# Patient Record
Sex: Male | Born: 1970
Health system: Southern US, Community
[De-identification: ages and names within clinical notes are randomized; demographics above are authoritative.]

## PROBLEM LIST (undated history)

## (undated) DIAGNOSIS — I10 Essential (primary) hypertension: Secondary | ICD-10-CM

## (undated) DIAGNOSIS — J45909 Unspecified asthma, uncomplicated: Secondary | ICD-10-CM

## (undated) DIAGNOSIS — T7840XA Allergy, unspecified, initial encounter: Secondary | ICD-10-CM

## (undated) HISTORY — DX: Unspecified asthma, uncomplicated: J45.909

## (undated) HISTORY — DX: Allergy, unspecified, initial encounter: T78.40XA

## (undated) HISTORY — DX: Essential (primary) hypertension: I10

---

## 2007-09-13 ENCOUNTER — Inpatient Hospital Stay (HOSPITAL_COMMUNITY): Admission: EM | Admit: 2007-09-13 | Discharge: 2007-09-14 | Payer: Self-pay | Admitting: Emergency Medicine

## 2011-05-12 NOTE — Discharge Summary (Signed)
NAMEZED, WANNINGER NO.:  [id number]   MEDICAL RECORD NO.:  [id number]          PATIENT TYPE:  INP   LOCATION:  A306                          FACILITY:  [facility]   PHYSICIAN:  Skeet Latch, DO    DATE OF BIRTH:  1971-05-13   DATE OF ADMISSION:  09/13/2007  DATE OF DISCHARGE:  09/17/2008LH                               DISCHARGE SUMMARY   PRIMARY CARE PHYSICIAN:  Patrica Duel, M.D.   DISCHARGE DIAGNOSES:  1. Sigmoid diverticulitis.  2. Abdominal pain.   BRIEF HOSPITAL COURSE:  Mr. Herendeen is a 40 year old Caucasian male with  a history of mood disorder and asthma, who presented after noticing 2  weeks prior he started having left-sided abdominal pain.  The patient  thought that the pain was related to some kidney stones.  The patient  states this subsequently got worse and 1 day prior to admission states  that the pain was a 10/10 in intensity and decided to come to the  emergency room for evaluation.  The patient described the pain as dull,  achy and constant with no radiation.  The patient did have a bowel  movement which was normal and stated that there was no blood in his  stool.  The patient has no history of similar complaints in the past but  did have, however, 2-3 kidney stones a couple of years ago.  Upon being  seen in the emergency room, he was having tenderness in his left lower  quadrant.  No rebound, rigidity or guarding.  He did have bowel sounds.   Initial lab studies had a white count 12.7.  his basic metabolic panel  was normal.  His UA showed some small bilirubin.  He did have a CT scan  of his abdomen and pelvis, which showed a fatty liver and evidence of  sigmoid diverticulitis.  Also it was recommended that the patient have a  follow-up colonoscopy after resolution of his acute inflammatory process  to exclude any underlying processes.  After being seen, the patient was  started on IV Cipro and Flagyl daily, and the patient was placed on  DVT  as well as GI prophylaxis.  The patient also placed on his Lexapro and  albuterol, which he takes daily.  Today patient is stable.  He states  this pain is greatly improved and feel the patient is stable enough for  discharge.   VITALS ON DISCHARGE:  Temperature is 97.2, pulse 75, respirations 20,  blood pressure 102/58.   CBC:  White count 13.4, hemoglobin 13.3, hematocrit 38.8, platelets 286.  Sodium 134, potassium 4.2, chloride 104, CO2 27, glucose 116, BUN 7,  creatinine 1.01.   MEDICATIONS ON DISCHARGE:  1. Albuterol inhaler as needed.  2. The patient is to restart his Lexapro at previous dose, which I      believe is 10 mg daily.  3. The patient had hydrocodone as an old prescription also.  4. Cipro 500 mg twice a day for 10 days.  5. Flagyl 500 mg three times a day for 10 days.   CONDITION ON DISCHARGE:  Stable.   DISPOSITION:  The patient to be discharged to home.   INSTRUCTIONS:  The patient is to follow up with Dr. Nobie Putnam in 7-10  days.  His diet:  The patient is to have a clear liquid diet and advance  as tolerated closer to when his antibiotics are finished.  At that time  the patient is to be a high-fiber diet.  The patient was also given  instructions about diverticulitis prior to discharge.  The patient also  told if he had any severe abdominal pain or change in symptoms to return  to emergency room for evaluation.      Skeet Latch, DO  Electronically Signed     SM/MEDQ  D:  09/14/2007  T:  09/14/2007  Job:  [id number]   cc:   Patrica Duel, M.D.  Fax: [phone]

## 2011-05-12 NOTE — H&P (Signed)
NAMEDAVEN, MONTZ NO.:  [id number]   MEDICAL RECORD NO.:  [id number]          PATIENT TYPE:  INP   LOCATION:  A306                          FACILITY:  [facility]   PHYSICIAN:  Osvaldo Shipper, MD     DATE OF BIRTH:  [birth year]   DATE OF ADMISSION:  09/13/2007  DATE OF DISCHARGE:  LH                              HISTORY & PHYSICAL   PRIMARY CARE PHYSICIAN:  Patrica Duel, M.D.   CHIEF COMPLAINT:  Left-sided abdominal pain for 2 weeks.   HISTORY OF PRESENT ILLNESS:  The patient is a 40 year old Caucasian male  with a history of mood disorder and asthma who was in his usual state of  health until two weeks ago when he started noticing pain in the left  side of his abdomen.  The patient felt that his pain was related to  kidney stones, so he ignored it.  The patient subsequently got worse,  and yesterday the pain really got worse, became 10/10 in intensity, and  he decided to come into the ED this morning.  The patient describes the  pain as a dull, aching pain, constant with no radiation.  It is made  worse with movement, relieved by lying down.  No precipitating factors  identified.  There has been some nausea with no emesis.  No history of  any diarrhea. No history of constipation as well.  He had a bowel  movement this morning which was normal, no blood in the stool.   No similar history of such complaints in the past.  He passed 2-3 kidney  stones a couple of years ago, but since then he has had no issues.  He  also gives a history of frequent urination with no dysuria at this time.   Denies any fever at home.   MEDICATIONS AT HOME:  1. Albuterol inhaler as needed.  2. Lexapro, but he has been off this for 1-1/2 months because he ran      out of this medication.   ALLERGIES:  PENICILLIN caused facial swelling when he was a child.   PAST MEDICAL HISTORY:  1. Asthma.  2. History of migraine headaches.  3. Mood disorder.   PAST SURGICAL HISTORY:  None.   SOCIAL HISTORY:  He lives in [place], works as a Naval architect,  operates in the [employer].  No tobacco use, no drug  use, occasional alcohol use.   FAMILY HISTORY:  Positive for coronary artery disease.  No history of  any cancers in the family as far as he knows.   REVIEW OF SYSTEMS:  GENERAL:  Positive for mild weakness.  HEENT:  Unremarkable.  CARDIOVASCULAR:  Unremarkable.  RESPIRATORY:  Unremarkable.  GI:  As in HPI.  GU:  As in HPI.  MUSCULOSKELETAL:  Unremarkable.  DERMATOLOGIC:  Unremarkable.  PSYCHIATRIC:  Unremarkable.  RHEUMATOLOGIC:  Unremarkable.  Other systems are unremarkable.   PHYSICAL EXAMINATION:  VITAL SIGNS:  In the ED, his temperature ws 97.5,  blood pressure 119/78, respiratory rate 20, pulse 82, O2 saturation 99%  on room air.  GENERAL:  Obese  white male in no distress.  HEENT:  No pallor, no icterus.  Oral mucous membranes moist.  No oral  lesions are noted.  LUNGS:  Clear to auscultation bilaterally.  CARDIOVASCULAR:  S1 and S2 normal, regular.  No murmurs appreciated.  No  rubs or bruits heard.  ABDOMEN:  Soft with tenderness in the left lower quadrant with no  rebound, rigidity, or guarding.  Bowel sounds are present.  No mass or  organomegaly appreciated.  EXTREMITIES:  No edema.  Peripheral pulses are palpable.  NEUROLOGIC:  No neurological deficits present.  Alert and oriented x3.   LABORATORY DATA:  White count 12.7.  BMET is normal.  UA showed small  bilirubin.  Blood cultures pending.   IMAGING STUDIES:  He did have a CAT scan of the abdomen and pelvis which  showed a fatty liver and showed evidence for sigmoid diverticulitis.   ASSESSMENT:  This is a 40 year old Caucasian male with no significant  medical problems who presents with left-sided abdominal pain for 2 weeks  and is found to have diverticulitis.  He does not have any medical  issues at this time.   PLAN:  1. Sigmoid diverticulitis.  We will keep him n.p.o.  today; maybe he      can just have some ice chips.  We will treat him with Cipro and      Flagyl.  He will need at least a 2-week course.  We will give him      initially IV to make sure he stabilizes and his symptoms improve.      We will recheck his white count tomorrow morning.  He will need to      be set up with GI in about 6-8 weeks to undergo colonoscopy at a      future date.  At this time, I do not think he warrants inpatient GI      evaluation.  2. Deep vein thrombosis and gastrointestinal prophylaxes initiated.  3. Continue Lexapro and albuterol inhaler.  4. Will provide pain relief with Dilaudid and antiemetics with Zofran.      Osvaldo Shipper, MD  Electronically Signed     GK/MEDQ  D:  09/13/2007  T:  09/13/2007  Job:  [id number]   cc:   Patrica Duel, M.D.  Fax: [phone]   R. Roetta Sessions, M.D.  [address]    [place] [address]

## 2011-10-08 LAB — DIFFERENTIAL
Blasts: 0
Eosinophils Relative: 1
Eosinophils Relative: 1
Lymphocytes Relative: 17
Lymphs Abs: 2.1
Metamyelocytes Relative: 0
Monocytes Relative: 8
Myelocytes: 0
Neutro Abs: 9.4 — ABNORMAL HIGH
Neutrophils Relative %: 74
Neutrophils Relative %: 80 — ABNORMAL HIGH
nRBC: 0

## 2011-10-08 LAB — CULTURE, BLOOD (ROUTINE X 2)
Culture: NO GROWTH
Culture: NO GROWTH
Report Status: 9212008
Report Status: 9212008

## 2011-10-08 LAB — CBC
HCT: 38.6 — ABNORMAL LOW
MCV: 89.4
Platelets: 280
Platelets: 286
RDW: 13.4
WBC: 12.7 — ABNORMAL HIGH
WBC: 13.4 — ABNORMAL HIGH

## 2011-10-08 LAB — HEPATIC FUNCTION PANEL
ALT: 32
AST: 25
Albumin: 3.4 — ABNORMAL LOW
Alkaline Phosphatase: 68
Total Protein: 6.5

## 2011-10-08 LAB — BASIC METABOLIC PANEL
BUN: 10
BUN: 7
Calcium: 8.6
Creatinine, Ser: 1.01
GFR calc non Af Amer: >60
GFR calc non Af Amer: >60
Glucose, Bld: 116 — ABNORMAL HIGH
Potassium: 4

## 2011-10-08 LAB — URINALYSIS, ROUTINE W REFLEX MICROSCOPIC
Ketones, ur: NEGATIVE
Nitrite: NEGATIVE
Specific Gravity, Urine: 1.025
pH: 6

## 2016-03-19 ENCOUNTER — Ambulatory Visit (INDEPENDENT_AMBULATORY_CARE_PROVIDER_SITE_OTHER): Payer: 59 | Admitting: Nurse Practitioner

## 2016-03-19 ENCOUNTER — Encounter: Payer: Self-pay | Admitting: Nurse Practitioner

## 2016-03-19 VITALS — BP 126/86 | Ht 71.0 in | Wt 253.2 lb

## 2016-03-19 DIAGNOSIS — J069 Acute upper respiratory infection, unspecified: Secondary | ICD-10-CM | POA: Diagnosis not present

## 2016-03-19 DIAGNOSIS — B9689 Other specified bacterial agents as the cause of diseases classified elsewhere: Secondary | ICD-10-CM

## 2016-03-19 MED ORDER — AZITHROMYCIN 250 MG PO TABS: ORAL_TABLET | ORAL | Status: DC

## 2016-03-19 MED ORDER — HYDROCODONE-HOMATROPINE 5-1.5 MG/5ML PO SYRP: 5.0000 mL | ORAL_SOLUTION | ORAL | Status: DC | PRN

## 2016-03-19 NOTE — Patient Instructions (Signed)
Nasacort AQ as directed

## 2016-03-21 ENCOUNTER — Encounter: Payer: Self-pay | Admitting: Nurse Practitioner

## 2016-03-21 NOTE — Progress Notes (Signed)
Subjective:  Presents as a new patient for complaints of congestion and sore throat that began on 3/19. Fever. Had a bad headache initially, this has resolved. Head congestion. Spells of nonproductive cough. Has used his inhaler 1. Occasional posttussive vomiting. No nausea. No diarrhea or abdominal pain. Taking fluids well. Voiding normal limit. Cough worse at nighttime.  Objective:   BP 126/86 mmHg  Ht 5' 11"  (1.803 m)  Wt 253 lb 4 oz (114.873 kg)  BMI 35.34 kg/m2 NAD. Alert, oriented. TMs clear effusion, no erythema. Pharynx mildly erythematous with slightly green PND noted. Neck supple with mild soft anterior adenopathy. Lungs clear. Heart regular rate rhythm.  Assessment: Bacterial upper respiratory infection  Plan:  Meds ordered this encounter  Medications                              . azithromycin (ZITHROMAX Z-PAK) 250 MG tablet    Sig: Take 2 tablets (500 mg) on  Day 1,  followed by 1 tablet (250 mg) once daily on Days 2 through 5.    Dispense:  6 each    Refill:  0    Order Specific Question:  Supervising Provider    Answer:  Mikey Kirschner [[id number]]  . HYDROcodone-homatropine (HYCODAN) 5-1.5 MG/5ML syrup    Sig: Take 5 mLs by mouth every 4 (four) hours as needed.    Dispense:  120 mL    Refill:  0    Order Specific Question:  Supervising Provider    Answer:  Mikey Kirschner [[id number]]   OTC meds as directed for daytime use. Drowsiness precautions for Hycodan syrup. Callback in 7-10 days if no improvement, sooner if worse.

## 2016-12-30 ENCOUNTER — Encounter: Payer: Self-pay | Admitting: Family Medicine

## 2016-12-30 ENCOUNTER — Ambulatory Visit (INDEPENDENT_AMBULATORY_CARE_PROVIDER_SITE_OTHER): Payer: 59 | Admitting: Family Medicine

## 2016-12-30 ENCOUNTER — Other Ambulatory Visit: Payer: Self-pay

## 2016-12-30 DIAGNOSIS — I10 Essential (primary) hypertension: Secondary | ICD-10-CM | POA: Diagnosis not present

## 2016-12-30 DIAGNOSIS — E7849 Other hyperlipidemia: Secondary | ICD-10-CM | POA: Insufficient documentation

## 2016-12-30 DIAGNOSIS — Z566 Other physical and mental strain related to work: Secondary | ICD-10-CM

## 2016-12-30 DIAGNOSIS — E1169 Type 2 diabetes mellitus with other specified complication: Secondary | ICD-10-CM | POA: Insufficient documentation

## 2016-12-30 MED ORDER — VENTOLIN HFA 108 (90 BASE) MCG/ACT IN AERS: 2.0000 | INHALATION_SPRAY | RESPIRATORY_TRACT | 12 refills | Status: DC | PRN

## 2016-12-30 MED ORDER — LISINOPRIL 10 MG PO TABS: 10.0000 mg | ORAL_TABLET | Freq: Every day | ORAL | 1 refills | Status: DC

## 2016-12-30 MED ORDER — CITALOPRAM HYDROBROMIDE 10 MG PO TABS: 10.0000 mg | ORAL_TABLET | Freq: Every day | ORAL | 1 refills | Status: DC

## 2016-12-30 MED ORDER — CITALOPRAM HYDROBROMIDE 20 MG PO TABS: 20.0000 mg | ORAL_TABLET | Freq: Every day | ORAL | 1 refills | Status: DC

## 2016-12-30 NOTE — Progress Notes (Signed)
   Subjective:    Patient ID: Frank Flores, male    DOB: Sep 27, 1971, 46 y.o.   MRN: [id number]  HPI   Patient presents to office today for medication management.  He states he needs refills on all  Medications.  Patient states he has DOT physical coming up. This patient denies any family history of heart disease cancer or cholesterol issues. 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 Has had blood pressure issues for several years and states he has taken Celexa for greater than 10 years and it done well with it.   Review of Systems  Constitutional: Negative for activity change, fatigue and fever.  Respiratory: Negative for cough and shortness of breath.   Cardiovascular: Negative for chest pain and leg swelling.  Neurological: Negative for headaches.       Objective:   Physical Exam  Constitutional: He appears well-nourished. No distress.  Cardiovascular: Normal rate, regular rhythm and normal heart sounds.   No murmur heard. Pulmonary/Chest: Effort normal and breath sounds normal. No respiratory distress.  Musculoskeletal: He exhibits no edema.  Lymphadenopathy:    He has no cervical adenopathy.  Neurological: He is alert.  Psychiatric: His behavior is normal.  Vitals reviewed.         Assessment & Plan:  Stress-stable. On Celexa doing well with this. Continue medication  HTN 90 day prescription given follow-up in 6 months  Watch diet stay physically active try to lose weight  Encourage patient to follow-up 6 months awaiting lab and records from his previous doctor [place]

## 2016-12-30 NOTE — Patient Instructions (Signed)
DASH Eating Plan DASH stands for "Dietary Approaches to Stop Hypertension." The DASH eating plan is a healthy eating plan that has been shown to reduce high blood pressure (hypertension). Additional health benefits may include reducing the risk of type 2 diabetes mellitus, heart disease, and stroke. The DASH eating plan may also help with weight loss. What do I need to know about the DASH eating plan? For the DASH eating plan, you will follow these general guidelines:  Choose foods with less than 150 milligrams of sodium per serving (as listed on the food label).  Use salt-free seasonings or herbs instead of table salt or sea salt.  Check with your health care provider or pharmacist before using salt substitutes.  Eat lower-sodium products. These are often labeled as "low-sodium" or "no salt added."  Eat fresh foods. Avoid eating a lot of canned foods.  Eat more vegetables, fruits, and low-fat dairy products.  Choose whole grains. Look for the word "whole" as the first word in the ingredient list.  Choose fish and skinless chicken or [place] more often than red meat. Limit fish, poultry, and meat to 6 oz (170 g) each day.  Limit sweets, desserts, sugars, and sugary drinks.  Choose heart-healthy fats.  Eat more home-cooked food and less restaurant, buffet, and fast food.  Limit fried foods.  Do not fry foods. Cook foods using methods such as baking, boiling, grilling, and broiling instead.  When eating at a restaurant, ask that your food be prepared with less salt, or no salt if possible. What foods can I eat? Seek help from a dietitian for individual calorie needs. Grains  Whole grain or whole wheat bread. Brown rice. Whole grain or whole wheat pasta. Quinoa, bulgur, and whole grain cereals. Low-sodium cereals. Corn or whole wheat flour tortillas. Whole grain cornbread. Whole grain crackers. Low-sodium crackers. Vegetables  Fresh or frozen vegetables (raw, steamed, roasted, or  grilled). Low-sodium or reduced-sodium tomato and vegetable juices. Low-sodium or reduced-sodium tomato sauce and paste. Low-sodium or reduced-sodium canned vegetables. Fruits  All fresh, canned (in natural juice), or frozen fruits. Meat and Other Protein Products  Ground beef (85% or leaner), grass-fed beef, or beef trimmed of fat. Skinless chicken or [place]. Ground chicken or [place]. Pork trimmed of fat. All fish and seafood. Eggs. Dried beans, peas, or lentils. Unsalted nuts and seeds. Unsalted canned beans. Dairy  Low-fat dairy products, such as skim or 1% milk, 2% or reduced-fat cheeses, low-fat ricotta or cottage cheese, or plain low-fat yogurt. Low-sodium or reduced-sodium cheeses. Fats and Oils  Tub margarines without trans fats. Light or reduced-fat mayonnaise and salad dressings (reduced sodium). Avocado. Safflower, olive, or canola oils. Natural peanut or almond butter. Other  Unsalted popcorn and pretzels. The items listed above may not be a complete list of recommended foods or beverages. Contact your dietitian for more options.  What foods are not recommended? Grains  White bread. White pasta. White rice. Refined cornbread. Bagels and croissants. Crackers that contain trans fat. Vegetables  Creamed or fried vegetables. Vegetables in a cheese sauce. Regular canned vegetables. Regular canned tomato sauce and paste. Regular tomato and vegetable juices. Fruits  Canned fruit in light or heavy syrup. Fruit juice. Meat and Other Protein Products  Fatty cuts of meat. Ribs, chicken wings, bacon, sausage, bologna, salami, chitterlings, fatback, hot dogs, bratwurst, and packaged luncheon meats. Salted nuts and seeds. Canned beans with salt. Dairy  Whole or 2% milk, cream, half-and-half, and cream cheese. Whole-fat or sweetened yogurt. Full-fat cheeses  or blue cheese. Nondairy creamers and whipped toppings. Processed cheese, cheese spreads, or cheese curds. Condiments  Onion and garlic  salt, seasoned salt, table salt, and sea salt. Canned and packaged gravies. Worcestershire sauce. Tartar sauce. Barbecue sauce. Teriyaki sauce. Soy sauce, including reduced sodium. Steak sauce. Fish sauce. Oyster sauce. Cocktail sauce. Horseradish. Ketchup and mustard. Meat flavorings and tenderizers. Bouillon cubes. Hot sauce. Tabasco sauce. Marinades. Taco seasonings. Relishes. Fats and Oils  Butter, stick margarine, lard, shortening, ghee, and bacon fat. Coconut, palm kernel, or palm oils. Regular salad dressings. Other  Pickles and olives. Salted popcorn and pretzels. The items listed above may not be a complete list of foods and beverages to avoid. Contact your dietitian for more information.  Where can I find more information? National Heart, Lung, and Blood Institute: travelstabloid.com This information is not intended to replace advice given to you by your health care provider. Make sure you discuss any questions you have with your health care provider. Document Released: 12/03/2011 Document Revised: 05/21/2016 Document Reviewed: 10/18/2013 Elsevier Interactive Patient Education  2017 [employer].

## 2017-01-19 ENCOUNTER — Ambulatory Visit (INDEPENDENT_AMBULATORY_CARE_PROVIDER_SITE_OTHER): Payer: 59 | Admitting: Family Medicine

## 2017-01-19 ENCOUNTER — Encounter: Payer: Self-pay | Admitting: Family Medicine

## 2017-01-19 VITALS — BP 114/76

## 2017-01-19 DIAGNOSIS — J019 Acute sinusitis, unspecified: Secondary | ICD-10-CM

## 2017-01-19 DIAGNOSIS — B9689 Other specified bacterial agents as the cause of diseases classified elsewhere: Secondary | ICD-10-CM

## 2017-01-19 MED ORDER — PREDNISONE 20 MG PO TABS: ORAL_TABLET | ORAL | 0 refills | Status: DC

## 2017-01-19 MED ORDER — AZITHROMYCIN 250 MG PO TABS: ORAL_TABLET | ORAL | 0 refills | Status: DC

## 2017-01-19 NOTE — Progress Notes (Signed)
   Subjective:    Patient ID: Frank Flores, male    DOB: October 14, 1971, 46 y.o.   MRN: [id number]  Cough  This is a new problem. The current episode started in the past 7 days. Associated symptoms include headaches, nasal congestion, a sore throat and wheezing. Treatments tried: otc meds.   This patient relates significant amount of cough congestion denies high fever chills sweats denies  difficulty breathing. He does relate intermittent wheeze that he uses albuterol for he does not smoke has never had to go to ER had been admitted for asthma   Review of Systems  HENT: Positive for sore throat.   Respiratory: Positive for cough and wheezing.   Neurological: Positive for headaches.       Objective:   Physical Exam  Lungs clear no crackles heart is regular pulse normal extremities no edema skin warm dry      Assessment & Plan:  Hypertension-overall doing well patient will do a follow-up office visit later this summer  Viral URI secondary rhinosinusitis antibiotic prescribed warning signs discussed albuterol when necessary cut back on Celexa to half the dose for the next 5 days  Prednisone prescription printed and given to the patient to use if he has progression in wheezing if significantly worse he needs to let us know. X-rays lab work not indicated currently

## 2017-07-01 ENCOUNTER — Encounter: Payer: Self-pay | Admitting: Family Medicine

## 2017-07-01 ENCOUNTER — Ambulatory Visit (INDEPENDENT_AMBULATORY_CARE_PROVIDER_SITE_OTHER): Payer: 59 | Admitting: Family Medicine

## 2017-07-01 VITALS — BP 122/78 | Ht 71.0 in | Wt 240.4 lb

## 2017-07-01 DIAGNOSIS — I10 Essential (primary) hypertension: Secondary | ICD-10-CM | POA: Diagnosis not present

## 2017-07-01 MED ORDER — CITALOPRAM HYDROBROMIDE 20 MG PO TABS: 20.0000 mg | ORAL_TABLET | Freq: Every day | ORAL | 1 refills | Status: DC

## 2017-07-01 MED ORDER — LISINOPRIL 10 MG PO TABS: 10.0000 mg | ORAL_TABLET | Freq: Every day | ORAL | 1 refills | Status: DC

## 2017-07-01 NOTE — Patient Instructions (Signed)
DASH Eating Plan DASH stands for "Dietary Approaches to Stop Hypertension." The DASH eating plan is a healthy eating plan that has been shown to reduce high blood pressure (hypertension). It may also reduce your risk for type 2 diabetes, heart disease, and stroke. The DASH eating plan may also help with weight loss. What are tips for following this plan? General guidelines  Avoid eating more than 2,300 mg (milligrams) of salt (sodium) a day. If you have hypertension, you may need to reduce your sodium intake to 1,500 mg a day.  Limit alcohol intake to no more than 1 drink a day for nonpregnant women and 2 drinks a day for men. One drink equals 12 oz of beer, 5 oz of wine, or 1 oz of hard liquor.  Work with your health care provider to maintain a healthy body weight or to lose weight. Ask what an ideal weight is for you.  Get at least 30 minutes of exercise that causes your heart to beat faster (aerobic exercise) most days of the week. Activities may include walking, swimming, or biking.  Work with your health care provider or diet and nutrition specialist (dietitian) to adjust your eating plan to your individual calorie needs. Reading food labels  Check food labels for the amount of sodium per serving. Choose foods with less than 5 percent of the Daily Value of sodium. Generally, foods with less than 300 mg of sodium per serving fit into this eating plan.  To find whole grains, look for the word "whole" as the first word in the ingredient list. Shopping  Buy products labeled as "low-sodium" or "no salt added."  Buy fresh foods. Avoid canned foods and premade or frozen meals. Cooking  Avoid adding salt when cooking. Use salt-free seasonings or herbs instead of table salt or sea salt. Check with your health care provider or pharmacist before using salt substitutes.  Do not fry foods. Cook foods using healthy methods such as baking, boiling, grilling, and broiling instead.  Cook with  heart-healthy oils, such as olive, canola, soybean, or sunflower oil. Meal planning   Eat a balanced diet that includes: ? 5 or more servings of fruits and vegetables each day. At each meal, try to fill half of your plate with fruits and vegetables. ? Up to 6-8 servings of whole grains each day. ? Less than 6 oz of lean meat, poultry, or fish each day. A 3-oz serving of meat is about the same size as a deck of cards. One egg equals 1 oz. ? 2 servings of low-fat dairy each day. ? A serving of nuts, seeds, or beans 5 times each week. ? Heart-healthy fats. Healthy fats called Omega-3 fatty acids are found in foods such as flaxseeds and coldwater fish, like sardines, salmon, and mackerel.  Limit how much you eat of the following: ? Canned or prepackaged foods. ? Food that is high in trans fat, such as fried foods. ? Food that is high in saturated fat, such as fatty meat. ? Sweets, desserts, sugary drinks, and other foods with added sugar. ? Full-fat dairy products.  Do not salt foods before eating.  Try to eat at least 2 vegetarian meals each week.  Eat more home-cooked food and less restaurant, buffet, and fast food.  When eating at a restaurant, ask that your food be prepared with less salt or no salt, if possible. What foods are recommended? The items listed may not be a complete list. Talk with your dietitian about what   dietary choices are best for you. Grains Whole-grain or whole-wheat bread. Whole-grain or whole-wheat pasta. Brown rice. Oatmeal. Quinoa. Bulgur. Whole-grain and low-sodium cereals. Pita bread. Low-fat, low-sodium crackers. Whole-wheat flour tortillas. Vegetables Fresh or frozen vegetables (raw, steamed, roasted, or grilled). Low-sodium or reduced-sodium tomato and vegetable juice. Low-sodium or reduced-sodium tomato sauce and tomato paste. Low-sodium or reduced-sodium canned vegetables. Fruits All fresh, dried, or frozen fruit. Canned fruit in natural juice (without  added sugar). Meat and other protein foods Skinless chicken or turkey. Ground chicken or turkey. Pork with fat trimmed off. Fish and seafood. Egg whites. Dried beans, peas, or lentils. Unsalted nuts, nut butters, and seeds. Unsalted canned beans. Lean cuts of beef with fat trimmed off. Low-sodium, lean deli meat. Dairy Low-fat (1%) or fat-free (skim) milk. Fat-free, low-fat, or reduced-fat cheeses. Nonfat, low-sodium ricotta or cottage cheese. Low-fat or nonfat yogurt. Low-fat, low-sodium cheese. Fats and oils Soft margarine without trans fats. Vegetable oil. Low-fat, reduced-fat, or light mayonnaise and salad dressings (reduced-sodium). Canola, safflower, olive, soybean, and sunflower oils. Avocado. Seasoning and other foods Herbs. Spices. Seasoning mixes without salt. Unsalted popcorn and pretzels. Fat-free sweets. What foods are not recommended? The items listed may not be a complete list. Talk with your dietitian about what dietary choices are best for you. Grains Baked goods made with fat, such as croissants, muffins, or some breads. Dry pasta or rice meal packs. Vegetables Creamed or fried vegetables. Vegetables in a cheese sauce. Regular canned vegetables (not low-sodium or reduced-sodium). Regular canned tomato sauce and paste (not low-sodium or reduced-sodium). Regular tomato and vegetable juice (not low-sodium or reduced-sodium). Pickles. Olives. Fruits Canned fruit in a light or heavy syrup. Fried fruit. Fruit in cream or butter sauce. Meat and other protein foods Fatty cuts of meat. Ribs. Fried meat. Bacon. Sausage. Bologna and other processed lunch meats. Salami. Fatback. Hotdogs. Bratwurst. Salted nuts and seeds. Canned beans with added salt. Canned or smoked fish. Whole eggs or egg yolks. Chicken or turkey with skin. Dairy Whole or 2% milk, cream, and half-and-half. Whole or full-fat cream cheese. Whole-fat or sweetened yogurt. Full-fat cheese. Nondairy creamers. Whipped toppings.  Processed cheese and cheese spreads. Fats and oils Butter. Stick margarine. Lard. Shortening. Ghee. Bacon fat. Tropical oils, such as coconut, palm kernel, or palm oil. Seasoning and other foods Salted popcorn and pretzels. Onion salt, garlic salt, seasoned salt, table salt, and sea salt. Worcestershire sauce. Tartar sauce. Barbecue sauce. Teriyaki sauce. Soy sauce, including reduced-sodium. Steak sauce. Canned and packaged gravies. Fish sauce. Oyster sauce. Cocktail sauce. Horseradish that you find on the shelf. Ketchup. Mustard. Meat flavorings and tenderizers. Bouillon cubes. Hot sauce and Tabasco sauce. Premade or packaged marinades. Premade or packaged taco seasonings. Relishes. Regular salad dressings. Where to find more information:  National Heart, Lung, and Blood Institute: www.nhlbi.nih.gov  American Heart Association: www.heart.org Summary  The DASH eating plan is a healthy eating plan that has been shown to reduce high blood pressure (hypertension). It may also reduce your risk for type 2 diabetes, heart disease, and stroke.  With the DASH eating plan, you should limit salt (sodium) intake to 2,300 mg a day. If you have hypertension, you may need to reduce your sodium intake to 1,500 mg a day.  When on the DASH eating plan, aim to eat more fresh fruits and vegetables, whole grains, lean proteins, low-fat dairy, and heart-healthy fats.  Work with your health care provider or diet and nutrition specialist (dietitian) to adjust your eating plan to your individual   calorie needs. This information is not intended to replace advice given to you by your health care provider. Make sure you discuss any questions you have with your health care provider. Document Released: 12/03/2011 Document Revised: 12/07/2016 Document Reviewed: 12/07/2016 Elsevier Interactive Patient Education  2017 [employer] Inc.  

## 2017-07-01 NOTE — Progress Notes (Signed)
   Subjective:    Patient ID: Frank Flores, male    DOB: September 30, 1971, 46 y.o.   MRN: [id number]  Hypertension  This is a chronic problem. The current episode started more than 1 year ago. Pertinent negatives include no chest pain, headaches or shortness of breath.   States no other concerns this visit. Patient has been watching diet try and excise try to lose weight Plays a lot of golf Under a lot of stress with his work but denies being depressed about it. Review of Systems  Constitutional: Negative for activity change, fatigue and fever.  Respiratory: Negative for cough and shortness of breath.   Cardiovascular: Negative for chest pain and leg swelling.  Neurological: Negative for headaches.       Objective:   Physical Exam  Constitutional: He appears well-nourished. No distress.  Cardiovascular: Normal rate, regular rhythm and normal heart sounds.   No murmur heard. Pulmonary/Chest: Effort normal and breath sounds normal. No respiratory distress.  Musculoskeletal: He exhibits no edema.  Lymphadenopathy:    He has no cervical adenopathy.  Neurological: He is alert.  Psychiatric: His behavior is normal.  Vitals reviewed.   His workplace is doing lab work he will send [place] a copy      Assessment & Plan:  HTN good control watch diet stay active continue try to lose weight  Continue medication follow-up in 6 months  Moods doing well continue Celexa  Patient is working hard at losing weight watch diet exercise follow-up 6 months

## 2017-12-31 ENCOUNTER — Encounter: Payer: Self-pay | Admitting: Family Medicine

## 2017-12-31 ENCOUNTER — Ambulatory Visit: Payer: 59 | Admitting: Family Medicine

## 2017-12-31 VITALS — BP 110/80 | Ht 71.0 in | Wt 252.0 lb

## 2017-12-31 DIAGNOSIS — I10 Essential (primary) hypertension: Secondary | ICD-10-CM

## 2017-12-31 DIAGNOSIS — Z13220 Encounter for screening for lipoid disorders: Secondary | ICD-10-CM

## 2017-12-31 MED ORDER — CITALOPRAM HYDROBROMIDE 20 MG PO TABS: 20.0000 mg | ORAL_TABLET | Freq: Every day | ORAL | 1 refills | Status: DC

## 2017-12-31 MED ORDER — LISINOPRIL 10 MG PO TABS: 10.0000 mg | ORAL_TABLET | Freq: Every day | ORAL | 1 refills | Status: DC

## 2017-12-31 NOTE — Progress Notes (Signed)
   Subjective:    Patient ID: Frank Flores, male    DOB: Mar 29, 1971, 47 y.o.   MRN: [id number]  Hypertension  This is a chronic problem. Pertinent negatives include no chest pain, headaches or shortness of breath. Treatments tried: lisinopril.   Right arm pain off and on for 6 months.  Patient had intermittent right arm pain in the deltoid region bicep region is during the summer when he is doing a lot of lifting it is now actually doing much better he denies any numbness or tingling  He states he is taking blood pressure medicine watching diet trying to exercise some denies any chest tightness pressure pain shortness of breath  Review of Systems  Constitutional: Negative for activity change, fatigue and fever.  Respiratory: Negative for cough and shortness of breath.   Cardiovascular: Negative for chest pain and leg swelling.  Neurological: Negative for headaches.       Objective:   Physical Exam  Constitutional: He appears well-nourished. No distress.  Cardiovascular: Normal rate, regular rhythm and normal heart sounds.  No murmur heard. Pulmonary/Chest: Effort normal and breath sounds normal. No respiratory distress.  Musculoskeletal: He exhibits no edema.  Lymphadenopathy:    He has no cervical adenopathy.  Neurological: He is alert.  Psychiatric: His behavior is normal.  Vitals reviewed.   Blood pressure was rechecked twice a letter was written for the patient blood pressure under good control      Assessment & Plan:  HTN- Patient was seen today as part of a visit regarding hypertension. The importance of healthy diet and regular physical activity was discussed. The importance of compliance with medications discussed. Ideal goal is to keep blood pressure low elevated levels certainly below 782/42 when possible. The patient was counseled that keeping blood pressure under control lessen his risk of heart attack, stroke, kidney failure, and early death. The importance of  regular follow-ups was discussed with the patient. Low-salt diet such as DASH recommended. Regular physical activity was recommended as well. Patient was advised to keep regular follow-ups.  I would advise patient to get lab work including metabolic 7 and lipid await the results of this  Patient was encouraged to exercise watch diet try to trim weight down continue medication follow-up 6 months  The right arm pain returns to follow-up

## 2017-12-31 NOTE — Patient Instructions (Signed)
DASH Eating Plan DASH stands for "Dietary Approaches to Stop Hypertension." The DASH eating plan is a healthy eating plan that has been shown to reduce high blood pressure (hypertension). It may also reduce your risk for type 2 diabetes, heart disease, and stroke. The DASH eating plan may also help with weight loss. What are tips for following this plan? General guidelines  Avoid eating more than 2,300 mg (milligrams) of salt (sodium) a day. If you have hypertension, you may need to reduce your sodium intake to 1,500 mg a day.  Limit alcohol intake to no more than 1 drink a day for nonpregnant women and 2 drinks a day for men. One drink equals 12 oz of beer, 5 oz of wine, or 1 oz of hard liquor.  Work with your health care provider to maintain a healthy body weight or to lose weight. Ask what an ideal weight is for you.  Get at least 30 minutes of exercise that causes your heart to beat faster (aerobic exercise) most days of the week. Activities may include walking, swimming, or biking.  Work with your health care provider or diet and nutrition specialist (dietitian) to adjust your eating plan to your individual calorie needs. Reading food labels  Check food labels for the amount of sodium per serving. Choose foods with less than 5 percent of the Daily Value of sodium. Generally, foods with less than 300 mg of sodium per serving fit into this eating plan.  To find whole grains, look for the word "whole" as the first word in the ingredient list. Shopping  Buy products labeled as "low-sodium" or "no salt added."  Buy fresh foods. Avoid canned foods and premade or frozen meals. Cooking  Avoid adding salt when cooking. Use salt-free seasonings or herbs instead of table salt or sea salt. Check with your health care provider or pharmacist before using salt substitutes.  Do not fry foods. Cook foods using healthy methods such as baking, boiling, grilling, and broiling instead.  Cook with  heart-healthy oils, such as olive, canola, soybean, or sunflower oil. Meal planning   Eat a balanced diet that includes: ? 5 or more servings of fruits and vegetables each day. At each meal, try to fill half of your plate with fruits and vegetables. ? Up to 6-8 servings of whole grains each day. ? Less than 6 oz of lean meat, poultry, or fish each day. A 3-oz serving of meat is about the same size as a deck of cards. One egg equals 1 oz. ? 2 servings of low-fat dairy each day. ? A serving of nuts, seeds, or beans 5 times each week. ? Heart-healthy fats. Healthy fats called Omega-3 fatty acids are found in foods such as flaxseeds and coldwater fish, like sardines, salmon, and mackerel.  Limit how much you eat of the following: ? Canned or prepackaged foods. ? Food that is high in trans fat, such as fried foods. ? Food that is high in saturated fat, such as fatty meat. ? Sweets, desserts, sugary drinks, and other foods with added sugar. ? Full-fat dairy products.  Do not salt foods before eating.  Try to eat at least 2 vegetarian meals each week.  Eat more home-cooked food and less restaurant, buffet, and fast food.  When eating at a restaurant, ask that your food be prepared with less salt or no salt, if possible. What foods are recommended? The items listed may not be a complete list. Talk with your dietitian about what   dietary choices are best for you. Grains Whole-grain or whole-wheat bread. Whole-grain or whole-wheat pasta. Brown rice. Oatmeal. Quinoa. Bulgur. Whole-grain and low-sodium cereals. Pita bread. Low-fat, low-sodium crackers. Whole-wheat flour tortillas. Vegetables Fresh or frozen vegetables (raw, steamed, roasted, or grilled). Low-sodium or reduced-sodium tomato and vegetable juice. Low-sodium or reduced-sodium tomato sauce and tomato paste. Low-sodium or reduced-sodium canned vegetables. Fruits All fresh, dried, or frozen fruit. Canned fruit in natural juice (without  added sugar). Meat and other protein foods Skinless chicken or turkey. Ground chicken or turkey. Pork with fat trimmed off. Fish and seafood. Egg whites. Dried beans, peas, or lentils. Unsalted nuts, nut butters, and seeds. Unsalted canned beans. Lean cuts of beef with fat trimmed off. Low-sodium, lean deli meat. Dairy Low-fat (1%) or fat-free (skim) milk. Fat-free, low-fat, or reduced-fat cheeses. Nonfat, low-sodium ricotta or cottage cheese. Low-fat or nonfat yogurt. Low-fat, low-sodium cheese. Fats and oils Soft margarine without trans fats. Vegetable oil. Low-fat, reduced-fat, or light mayonnaise and salad dressings (reduced-sodium). Canola, safflower, olive, soybean, and sunflower oils. Avocado. Seasoning and other foods Herbs. Spices. Seasoning mixes without salt. Unsalted popcorn and pretzels. Fat-free sweets. What foods are not recommended? The items listed may not be a complete list. Talk with your dietitian about what dietary choices are best for you. Grains Baked goods made with fat, such as croissants, muffins, or some breads. Dry pasta or rice meal packs. Vegetables Creamed or fried vegetables. Vegetables in a cheese sauce. Regular canned vegetables (not low-sodium or reduced-sodium). Regular canned tomato sauce and paste (not low-sodium or reduced-sodium). Regular tomato and vegetable juice (not low-sodium or reduced-sodium). Pickles. Olives. Fruits Canned fruit in a light or heavy syrup. Fried fruit. Fruit in cream or butter sauce. Meat and other protein foods Fatty cuts of meat. Ribs. Fried meat. Bacon. Sausage. Bologna and other processed lunch meats. Salami. Fatback. Hotdogs. Bratwurst. Salted nuts and seeds. Canned beans with added salt. Canned or smoked fish. Whole eggs or egg yolks. Chicken or turkey with skin. Dairy Whole or 2% milk, cream, and half-and-half. Whole or full-fat cream cheese. Whole-fat or sweetened yogurt. Full-fat cheese. Nondairy creamers. Whipped toppings.  Processed cheese and cheese spreads. Fats and oils Butter. Stick margarine. Lard. Shortening. Ghee. Bacon fat. Tropical oils, such as coconut, palm kernel, or palm oil. Seasoning and other foods Salted popcorn and pretzels. Onion salt, garlic salt, seasoned salt, table salt, and sea salt. Worcestershire sauce. Tartar sauce. Barbecue sauce. Teriyaki sauce. Soy sauce, including reduced-sodium. Steak sauce. Canned and packaged gravies. Fish sauce. Oyster sauce. Cocktail sauce. Horseradish that you find on the shelf. Ketchup. Mustard. Meat flavorings and tenderizers. Bouillon cubes. Hot sauce and Tabasco sauce. Premade or packaged marinades. Premade or packaged taco seasonings. Relishes. Regular salad dressings. Where to find more information:  National Heart, Lung, and Blood Institute: www.nhlbi.nih.gov  American Heart Association: www.heart.org Summary  The DASH eating plan is a healthy eating plan that has been shown to reduce high blood pressure (hypertension). It may also reduce your risk for type 2 diabetes, heart disease, and stroke.  With the DASH eating plan, you should limit salt (sodium) intake to 2,300 mg a day. If you have hypertension, you may need to reduce your sodium intake to 1,500 mg a day.  When on the DASH eating plan, aim to eat more fresh fruits and vegetables, whole grains, lean proteins, low-fat dairy, and heart-healthy fats.  Work with your health care provider or diet and nutrition specialist (dietitian) to adjust your eating plan to your individual   calorie needs. This information is not intended to replace advice given to you by your health care provider. Make sure you discuss any questions you have with your health care provider. Document Released: 12/03/2011 Document Revised: 12/07/2016 Document Reviewed: 12/07/2016 Elsevier Interactive Patient Education  [employer].

## 2018-01-10 ENCOUNTER — Other Ambulatory Visit: Payer: Self-pay | Admitting: Family Medicine

## 2018-02-05 DIAGNOSIS — I10 Essential (primary) hypertension: Secondary | ICD-10-CM | POA: Diagnosis not present

## 2018-02-06 LAB — BASIC METABOLIC PANEL
BUN/Creatinine Ratio: 16 (ref 9–20)
BUN: 15 mg/dL (ref 6–24)
CO2: 23 mmol/L (ref 20–29)
CREATININE: 0.94 mg/dL (ref 0.76–1.27)
Calcium: 9.7 mg/dL (ref 8.7–10.2)
Chloride: 104 mmol/L (ref 96–106)
GFR calc non Af Amer: 98 mL/min/{1.73_m2} (ref >59)
GFR, EST AFRICAN AMERICAN: 113 mL/min/{1.73_m2} (ref >59)
GLUCOSE: 117 mg/dL — AB (ref 65–99)
Potassium: 5.6 mmol/L — ABNORMAL HIGH (ref 3.5–5.2)
SODIUM: 143 mmol/L (ref 134–144)

## 2018-02-06 LAB — HEPATIC FUNCTION PANEL
ALK PHOS: 72 IU/L (ref 39–117)
ALT: 39 IU/L (ref 0–44)
AST: 27 IU/L (ref 0–40)
Albumin: 4.5 g/dL (ref 3.5–5.5)
BILIRUBIN, DIRECT: 0.12 mg/dL (ref 0.00–0.40)
Bilirubin Total: 0.5 mg/dL (ref 0.0–1.2)
Total Protein: 6.8 g/dL (ref 6.0–8.5)

## 2018-02-06 LAB — LIPID PANEL
Chol/HDL Ratio: 4.8 ratio (ref 0.0–5.0)
Cholesterol, Total: 201 mg/dL — ABNORMAL HIGH (ref 100–199)
HDL: 42 mg/dL (ref >39)
LDL Calculated: 137 mg/dL — ABNORMAL HIGH (ref 0–99)
Triglycerides: 108 mg/dL (ref 0–149)
VLDL CHOLESTEROL CAL: 22 mg/dL (ref 5–40)

## 2018-03-23 ENCOUNTER — Ambulatory Visit: Payer: 59 | Admitting: Nurse Practitioner

## 2018-03-23 ENCOUNTER — Encounter: Payer: Self-pay | Admitting: Nurse Practitioner

## 2018-03-23 ENCOUNTER — Encounter: Payer: Self-pay | Admitting: Family Medicine

## 2018-03-23 VITALS — BP 128/88 | Temp 99.5°F | Ht 71.0 in | Wt 249.6 lb

## 2018-03-23 DIAGNOSIS — J111 Influenza due to unidentified influenza virus with other respiratory manifestations: Secondary | ICD-10-CM

## 2018-03-23 MED ORDER — OSELTAMIVIR PHOSPHATE 75 MG PO CAPS: 75.0000 mg | ORAL_CAPSULE | Freq: Two times a day (BID) | ORAL | 0 refills | Status: DC

## 2018-03-23 NOTE — Patient Instructions (Signed)

## 2018-03-23 NOTE — Progress Notes (Signed)
Subjective:  Presents for c/o sore throat, fever, headache, cough, muscle aches and fatigue that began yesterday. Chills last night. Non productive cough. Had one episode of wheezing after prolonged cough which improved with albuterol inhaler. No ear pain. No N/V, diarrhea or abdominal pain. Decreased appetite but taking fluids well. Voiding nl.   Objective:   BP 128/88   Temp 99.5 F (37.5 C) (Oral)   Ht 5' 11"  (1.803 m)   Wt 249 lb 9.6 oz (113.2 kg)   BMI 34.81 kg/m  NAD. Face flushed and warm. Alert, oriented. TMs retracted, no erythema. Pharynx clear and moist. Neck supple with mild anterior adenopathy. Lungs clear. Heart RRR.   Assessment:  Influenza    Plan:   Meds ordered this encounter  Medications  . oseltamivir (TAMIFLU) 75 MG capsule    Sig: Take 1 capsule (75 mg total) by mouth 2 (two) times daily.    Dispense:  10 capsule    Refill:  0    Order Specific Question:   Supervising Provider    Answer:   Mikey Kirschner [[id number]]   Reviewed symptomatic care and warning signs. Call back if worsens or persists.

## 2018-04-04 ENCOUNTER — Telehealth: Payer: Self-pay | Admitting: Family Medicine

## 2018-04-04 ENCOUNTER — Encounter: Payer: Self-pay | Admitting: Family Medicine

## 2018-04-04 DIAGNOSIS — Z79899 Other long term (current) drug therapy: Secondary | ICD-10-CM

## 2018-04-04 DIAGNOSIS — E785 Hyperlipidemia, unspecified: Secondary | ICD-10-CM

## 2018-04-04 DIAGNOSIS — R739 Hyperglycemia, unspecified: Secondary | ICD-10-CM

## 2018-04-04 DIAGNOSIS — I10 Essential (primary) hypertension: Secondary | ICD-10-CM

## 2018-04-04 NOTE — Telephone Encounter (Signed)
Labs printed and message forwarded to Medical Records

## 2018-04-04 NOTE — Telephone Encounter (Signed)
Nurses-please print lab work for this patient he needs hemoglobin N8M, metabolic 7, lipid, liver diagnosis fasting hyperglycemia hyperlipidemia and hypertension  Once these lab papers are printed please forward this message and the lab papers to [place].  She needs to mail the letter that I just dictated along with these lab papers via certified mail to the patient thank you

## 2018-04-04 NOTE — Addendum Note (Signed)
Addended by: Karle Barr on: 04/04/2018 10:44 AM   Modules accepted: Orders

## 2018-05-02 ENCOUNTER — Encounter: Payer: Self-pay | Admitting: Family Medicine

## 2018-05-02 ENCOUNTER — Ambulatory Visit: Payer: 59 | Admitting: Family Medicine

## 2018-05-02 VITALS — BP 124/80 | Temp 98.6°F | Ht 71.0 in | Wt 249.0 lb

## 2018-05-02 DIAGNOSIS — B9689 Other specified bacterial agents as the cause of diseases classified elsewhere: Secondary | ICD-10-CM

## 2018-05-02 DIAGNOSIS — J019 Acute sinusitis, unspecified: Secondary | ICD-10-CM

## 2018-05-02 MED ORDER — CLARITHROMYCIN 500 MG PO TABS: 500.0000 mg | ORAL_TABLET | Freq: Two times a day (BID) | ORAL | 0 refills | Status: DC

## 2018-05-02 NOTE — Progress Notes (Signed)
   Subjective:    Patient ID: Frank Flores, male    DOB: 1971/11/24, 46 y.o.   MRN: [id number]  Sinusitis  This is a new problem. Episode onset: 8 - 9 days. Associated symptoms include congestion, coughing, headaches and a sore throat. (Wheezing, chest pain from coughing) Treatments tried: otc meds.   Gets spring time allergies fairly bad  Not bothering him bad   Hit ovr one week ago  Head was stopping up , di not fel right  Got in a lot of pollen     Sore thraot and srathct   [place]ples prssure,   Some bad cough   Pos produtive   bringing up ginky discharge       Review of Systems  HENT: Positive for congestion and sore throat.   Respiratory: Positive for cough.   Neurological: Positive for headaches.       Objective:   Physical Exam Alert, mild malaise. Hydration good Vitals stable. frontal/ maxillary tenderness evident positive nasal congestion. pharynx normal neck supple  lungs clear/no crackles or wheezes. heart regular in rhythm       Assessment & Plan:  Impression rhinosinusitis likely post viral, discussed with patient. plan antibiotics prescribed. Questions answered. Symptomatic care discussed. warning signs discussed. WSL

## 2018-06-21 ENCOUNTER — Encounter: Payer: Self-pay | Admitting: Family Medicine

## 2018-06-21 ENCOUNTER — Ambulatory Visit: Payer: 59 | Admitting: Family Medicine

## 2018-06-21 VITALS — BP 108/80 | Ht 71.0 in | Wt 245.0 lb

## 2018-06-21 DIAGNOSIS — I10 Essential (primary) hypertension: Secondary | ICD-10-CM

## 2018-06-21 DIAGNOSIS — E7849 Other hyperlipidemia: Secondary | ICD-10-CM

## 2018-06-21 DIAGNOSIS — R7301 Impaired fasting glucose: Secondary | ICD-10-CM | POA: Diagnosis not present

## 2018-06-21 DIAGNOSIS — Z79899 Other long term (current) drug therapy: Secondary | ICD-10-CM | POA: Diagnosis not present

## 2018-06-21 MED ORDER — CITALOPRAM HYDROBROMIDE 20 MG PO TABS: 20.0000 mg | ORAL_TABLET | Freq: Every day | ORAL | 1 refills | Status: DC

## 2018-06-21 MED ORDER — VALACYCLOVIR HCL 1 G PO TABS: ORAL_TABLET | ORAL | 5 refills | Status: DC

## 2018-06-21 MED ORDER — LISINOPRIL 10 MG PO TABS: 10.0000 mg | ORAL_TABLET | Freq: Every day | ORAL | 1 refills | Status: DC

## 2018-06-21 NOTE — Progress Notes (Signed)
   Subjective:    Patient ID: Frank Flores, male    DOB: 02-10-1971, 47 y.o.   MRN: [id number]  HPI  Patient is here today to follow up on chronic illnesses.he has Htn and is on Lisinopril 10 mg daily. He eats healthy and gets exercise daily at work. He does not see any specialist.  Patient for blood pressure check up. Patient relates compliance with meds. Todays BP reviewed with the patient. Patient denies issues with medication. Patient relates reasonable diet. Patient tries to minimize salt. Patient aware of BP goals.  Patient does try to watch his diet to some degree does exercise on a fairly regular basis on the weekends plays golf and he also works a very vigorous and strenuous job   Review of Systems  Constitutional: Negative for activity change, fatigue and fever.  HENT: Negative for congestion and rhinorrhea.   Respiratory: Negative for cough and shortness of breath.   Cardiovascular: Negative for chest pain and leg swelling.  Gastrointestinal: Negative for abdominal pain, diarrhea and nausea.  Genitourinary: Negative for dysuria and hematuria.  Neurological: Negative for weakness and headaches.  Psychiatric/Behavioral: Negative for behavioral problems.       Objective:   Physical Exam  Constitutional: He appears well-nourished. No distress.  Eyes: Right eye exhibits no discharge. Left eye exhibits no discharge.  Cardiovascular: Normal rate, regular rhythm and normal heart sounds.  No murmur heard. Pulmonary/Chest: Effort normal and breath sounds normal. No respiratory distress.  Musculoskeletal: He exhibits no edema.  Lymphadenopathy:    He has no cervical adenopathy.  Neurological: He is alert.  Psychiatric: His behavior is normal.  Vitals reviewed.    Patient states he gets his wellness through his workplace     Assessment & Plan:  Blood pressure good control continue current measures watch diet closely  Try to lose weight cardiovascular exercise  recommended Mild elevation fasting glucose watch starches closely check A1c Lab work later this summer  Follow-up in 6 months

## 2018-07-01 ENCOUNTER — Ambulatory Visit: Payer: 59 | Admitting: Family Medicine

## 2018-08-04 ENCOUNTER — Other Ambulatory Visit: Payer: Self-pay | Admitting: Family Medicine

## 2018-08-20 DIAGNOSIS — Z79899 Other long term (current) drug therapy: Secondary | ICD-10-CM | POA: Diagnosis not present

## 2018-08-20 DIAGNOSIS — E7849 Other hyperlipidemia: Secondary | ICD-10-CM | POA: Diagnosis not present

## 2018-08-20 DIAGNOSIS — I10 Essential (primary) hypertension: Secondary | ICD-10-CM | POA: Diagnosis not present

## 2018-08-20 DIAGNOSIS — R7301 Impaired fasting glucose: Secondary | ICD-10-CM | POA: Diagnosis not present

## 2018-08-21 ENCOUNTER — Encounter: Payer: Self-pay | Admitting: Family Medicine

## 2018-08-21 LAB — LIPID PANEL
CHOLESTEROL TOTAL: 200 mg/dL — AB (ref 100–199)
Chol/HDL Ratio: 4.5 ratio (ref 0.0–5.0)
HDL: 44 mg/dL (ref >39)
LDL CALC: 131 mg/dL — AB (ref 0–99)
Triglycerides: 124 mg/dL (ref 0–149)
VLDL Cholesterol Cal: 25 mg/dL (ref 5–40)

## 2018-08-21 LAB — BASIC METABOLIC PANEL
BUN / CREAT RATIO: 12 (ref 9–20)
BUN: 13 mg/dL (ref 6–24)
CHLORIDE: 102 mmol/L (ref 96–106)
CO2: 27 mmol/L (ref 20–29)
CREATININE: 1.07 mg/dL (ref 0.76–1.27)
Calcium: 9.6 mg/dL (ref 8.7–10.2)
GFR calc Af Amer: 96 mL/min/{1.73_m2} (ref >59)
GFR calc non Af Amer: 83 mL/min/{1.73_m2} (ref >59)
Glucose: 126 mg/dL — ABNORMAL HIGH (ref 65–99)
POTASSIUM: 5.4 mmol/L — AB (ref 3.5–5.2)
SODIUM: 141 mmol/L (ref 134–144)

## 2018-08-21 LAB — HEPATIC FUNCTION PANEL
ALBUMIN: 4.5 g/dL (ref 3.5–5.5)
ALT: 27 IU/L (ref 0–44)
AST: 23 IU/L (ref 0–40)
Alkaline Phosphatase: 63 IU/L (ref 39–117)
BILIRUBIN TOTAL: 0.4 mg/dL (ref 0.0–1.2)
BILIRUBIN, DIRECT: 0.13 mg/dL (ref 0.00–0.40)
Total Protein: 6.8 g/dL (ref 6.0–8.5)

## 2018-08-21 LAB — HEMOGLOBIN A1C
Est. average glucose Bld gHb Est-mCnc: 123 mg/dL
Hgb A1c MFr Bld: 5.9 % — ABNORMAL HIGH (ref 4.8–5.6)

## 2018-08-22 ENCOUNTER — Other Ambulatory Visit: Payer: Self-pay | Admitting: *Deleted

## 2018-08-22 ENCOUNTER — Other Ambulatory Visit: Payer: Self-pay | Admitting: Family Medicine

## 2018-08-22 DIAGNOSIS — E785 Hyperlipidemia, unspecified: Secondary | ICD-10-CM

## 2018-08-22 DIAGNOSIS — R7301 Impaired fasting glucose: Secondary | ICD-10-CM

## 2018-12-06 ENCOUNTER — Other Ambulatory Visit: Payer: Self-pay | Admitting: Family Medicine

## 2018-12-06 NOTE — Telephone Encounter (Signed)
90-day on each needs office visit

## 2018-12-16 ENCOUNTER — Ambulatory Visit: Payer: 59 | Admitting: Family Medicine

## 2018-12-31 DIAGNOSIS — E785 Hyperlipidemia, unspecified: Secondary | ICD-10-CM | POA: Diagnosis not present

## 2018-12-31 DIAGNOSIS — R7301 Impaired fasting glucose: Secondary | ICD-10-CM | POA: Diagnosis not present

## 2019-01-02 LAB — LIPID PANEL
Chol/HDL Ratio: 4.7 ratio (ref 0.0–5.0)
Cholesterol, Total: 213 mg/dL — ABNORMAL HIGH (ref 100–199)
HDL: 45 mg/dL (ref >39)
LDL Calculated: 131 mg/dL — ABNORMAL HIGH (ref 0–99)
TRIGLYCERIDES: 187 mg/dL — AB (ref 0–149)
VLDL Cholesterol Cal: 37 mg/dL (ref 5–40)

## 2019-01-02 LAB — HEMOGLOBIN A1C
Est. average glucose Bld gHb Est-mCnc: 131 mg/dL
Hgb A1c MFr Bld: 6.2 % — ABNORMAL HIGH (ref 4.8–5.6)

## 2019-01-06 ENCOUNTER — Ambulatory Visit: Payer: 59 | Admitting: Family Medicine

## 2019-01-06 ENCOUNTER — Encounter: Payer: Self-pay | Admitting: Family Medicine

## 2019-01-06 VITALS — BP 110/80 | Ht 71.0 in | Wt 248.0 lb

## 2019-01-06 DIAGNOSIS — R079 Chest pain, unspecified: Secondary | ICD-10-CM

## 2019-01-06 DIAGNOSIS — R7303 Prediabetes: Secondary | ICD-10-CM

## 2019-01-06 DIAGNOSIS — I10 Essential (primary) hypertension: Secondary | ICD-10-CM | POA: Diagnosis not present

## 2019-01-06 DIAGNOSIS — E7849 Other hyperlipidemia: Secondary | ICD-10-CM | POA: Diagnosis not present

## 2019-01-06 MED ORDER — LISINOPRIL 10 MG PO TABS: 10.0000 mg | ORAL_TABLET | Freq: Every day | ORAL | 1 refills | Status: DC

## 2019-01-06 MED ORDER — CITALOPRAM HYDROBROMIDE 20 MG PO TABS: ORAL_TABLET | ORAL | 1 refills | Status: DC

## 2019-01-06 NOTE — Progress Notes (Addendum)
Subjective:    Patient ID: Frank Flores, male    DOB: Jan 21, 1971, 48 y.o.   MRN: [id number]  HPI  Patient is here today to follow up on his chronic illnesses.  He has a history of Hypertension and is currently taking Lisinopril 10 mg one po qd. Patient for blood pressure check up.  The patient does have hypertension.  The patient is on medication.  Patient relates compliance with meds. Todays BP reviewed with the patient. Patient denies issues with medication. Patient relates reasonable diet. Patient tries to minimize salt. Patient aware of BP goals.  He has a history of stress at work and is currently taking Celexa 20 mg one po qd. Denies being depressed states Celexa helps him with his temperament keeping him calm  Does have cholesterol issues is gone worse recently does try to eat healthy not on any medications currently  He has a history of asthma as well he says and has an inhaler for prn uses.  He states he does try to eat healthy and gets exercise daily. He does not see any specialists. Patient does relate that he has had some upper chest discomfort on the left side that lasts for couple seconds at a time also on his left upper quadrant of the abdomen lasts a couple seconds at a time then it goes away not associated with activity does have family history of heart disease does have risk factors  Patient does state he does drink some beers but denies excessive beer use Review of Systems  Constitutional: Negative for activity change, fatigue and fever.  HENT: Negative for congestion and rhinorrhea.   Respiratory: Negative for cough and shortness of breath.   Cardiovascular: Negative for chest pain and leg swelling.  Gastrointestinal: Negative for abdominal pain, diarrhea and nausea.  Genitourinary: Negative for dysuria and hematuria.  Neurological: Negative for weakness and headaches.  Psychiatric/Behavioral: Negative for agitation and behavioral problems.       Objective:     Physical Exam Vitals signs reviewed.  Constitutional:      General: He is not in acute distress. HENT:     Head: Normocephalic and atraumatic.  Eyes:     General:        Right eye: No discharge.        Left eye: No discharge.  Neck:     Trachea: No tracheal deviation.  Cardiovascular:     Rate and Rhythm: Normal rate and regular rhythm.     Heart sounds: Normal heart sounds. No murmur.  Pulmonary:     Effort: Pulmonary effort is normal. No respiratory distress.     Breath sounds: Normal breath sounds.  Lymphadenopathy:     Cervical: No cervical adenopathy.  Skin:    General: Skin is warm and dry.  Neurological:     Mental Status: He is alert.     Coordination: Coordination normal.  Psychiatric:        Behavior: Behavior normal.           Assessment & Plan:  Hyperlipidemia-based on his numbers and percentage risk he does not have to start on medication currently  Blood pressure good control continue current measures  Mild obesity patient encouraged to try to lose weight watch diet  Prediabetes-we discussed in detail watching diet losing weight cutting back and avoiding alcohol use  Patient under some stress lost his dad to a heart attack a month ago Celexa doing well continue this  EKG does not show any  acute changes.  Is more likely that the chest discomfort is musculoskeletal  25 minutes was spent with the patient.  This statement verifies that 25 minutes was indeed spent with the patient.  More than 50% of this visit-total duration of the visit-was spent in counseling and coordination of care. The issues that the patient came in for today as reflected in the diagnosis (s) please refer to documentation for further details.   Follow-up 6 months lab work at that time

## 2019-01-06 NOTE — Addendum Note (Signed)
Addended by: Sallee Lange A on: 01/06/2019 06:24 PM   Modules accepted: Level of Service

## 2019-01-06 NOTE — Patient Instructions (Addendum)
Diabetes Mellitus and Nutrition, Adult When you have diabetes (diabetes mellitus), it is very important to have healthy eating habits because your blood sugar (glucose) levels are greatly affected by what you eat and drink. Eating healthy foods in the appropriate amounts, at about the same times every day, can help you:  Control your blood glucose.  Lower your risk of heart disease.  Improve your blood pressure.  Reach or maintain a healthy weight. Every person with diabetes is different, and each person has different needs for a meal plan. Your health care provider may recommend that you work with a diet and nutrition specialist (dietitian) to make a meal plan that is best for you. Your meal plan may vary depending on factors such as:  The calories you need.  The medicines you take.  Your weight.  Your blood glucose, blood pressure, and cholesterol levels.  Your activity level.  Other health conditions you have, such as heart or kidney disease. How do carbohydrates affect me? Carbohydrates, also called carbs, affect your blood glucose level more than any other type of food. Eating carbs naturally raises the amount of glucose in your blood. Carb counting is a method for keeping track of how many carbs you eat. Counting carbs is important to keep your blood glucose at a healthy level, especially if you use insulin or take certain oral diabetes medicines. It is important to know how many carbs you can safely have in each meal. This is different for every person. Your dietitian can help you calculate how many carbs you should have at each meal and for each snack. Foods that contain carbs include:  Bread, cereal, rice, pasta, and crackers.  Potatoes and corn.  Peas, beans, and lentils.  Milk and yogurt.  Fruit and juice.  Desserts, such as cakes, cookies, ice cream, and candy. How does alcohol affect me? Alcohol can cause a sudden decrease in blood glucose (hypoglycemia),  especially if you use insulin or take certain oral diabetes medicines. Hypoglycemia can be a life-threatening condition. Symptoms of hypoglycemia (sleepiness, dizziness, and confusion) are similar to symptoms of having too much alcohol. If your health care provider says that alcohol is safe for you, follow these guidelines:  Limit alcohol intake to no more than 1 drink per day for nonpregnant women and 2 drinks per day for men. One drink equals 12 oz of beer, 5 oz of wine, or 1 oz of hard liquor.  Do not drink on an empty stomach.  Keep yourself hydrated with water, diet soda, or unsweetened iced tea.  Keep in mind that regular soda, juice, and other mixers may contain a lot of sugar and must be counted as carbs. What are tips for following this plan?  Reading food labels  Start by checking the serving size on the "Nutrition Facts" label of packaged foods and drinks. The amount of calories, carbs, fats, and other nutrients listed on the label is based on one serving of the item. Many items contain more than one serving per package.  Check the total grams (g) of carbs in one serving. You can calculate the number of servings of carbs in one serving by dividing the total carbs by 15. For example, if a food has 30 g of total carbs, it would be equal to 2 servings of carbs.  Check the number of grams (g) of saturated and trans fats in one serving. Choose foods that have low or no amount of these fats.  Check the number of  milligrams (mg) of salt (sodium) in one serving. Most people should limit total sodium intake to less than 2,300 mg per day.  Always check the nutrition information of foods labeled as "low-fat" or "nonfat". These foods may be higher in added sugar or refined carbs and should be avoided.  Talk to your dietitian to identify your daily goals for nutrients listed on the label. Shopping  Avoid buying canned, premade, or processed foods. These foods tend to be high in fat, sodium,  and added sugar.  Shop around the outside edge of the grocery store. This includes fresh fruits and vegetables, bulk grains, fresh meats, and fresh dairy. Cooking  Use low-heat cooking methods, such as baking, instead of high-heat cooking methods like deep frying.  Cook using healthy oils, such as olive, canola, or sunflower oil.  Avoid cooking with butter, cream, or high-fat meats. Meal planning  Eat meals and snacks regularly, preferably at the same times every day. Avoid going long periods of time without eating.  Eat foods high in fiber, such as fresh fruits, vegetables, beans, and whole grains. Talk to your dietitian about how many servings of carbs you can eat at each meal.  Eat 4-6 ounces (oz) of lean protein each day, such as lean meat, chicken, fish, eggs, or tofu. One oz of lean protein is equal to: ? 1 oz of meat, chicken, or fish. ? 1 egg. ?  cup of tofu.  Eat some foods each day that contain healthy fats, such as avocado, nuts, seeds, and fish. Lifestyle  Check your blood glucose regularly.  Exercise regularly as told by your health care provider. This may include: ? 150 minutes of moderate-intensity or vigorous-intensity exercise each week. This could be brisk walking, biking, or water aerobics. ? Stretching and doing strength exercises, such as yoga or weightlifting, at least 2 times a week.  Take medicines as told by your health care provider.  Do not use any products that contain nicotine or tobacco, such as cigarettes and e-cigarettes. If you need help quitting, ask your health care provider.  Work with a Social worker or diabetes educator to identify strategies to manage stress and any emotional and social challenges. Questions to ask a health care provider  Do I need to meet with a diabetes educator?  Do I need to meet with a dietitian?  What number can I call if I have questions?  When are the best times to check my blood glucose? Where to find more  information:  American Diabetes Association: diabetes.org  Academy of Nutrition and Dietetics: www.eatright.[employer] of Diabetes and Digestive and Kidney Diseases (NIH): [employer] Summary  A healthy meal plan will help you control your blood glucose and maintain a healthy lifestyle.  Working with a diet and nutrition specialist (dietitian) can help you make a meal plan that is best for you.  Keep in mind that carbohydrates (carbs) and alcohol have immediate effects on your blood glucose levels. It is important to count carbs and to use alcohol carefully. This information is not intended to replace advice given to you by your health care provider. Make sure you discuss any questions you have with your health care provider. Document Released: 09/10/2005 Document Revised: 07/14/2017 Document Reviewed: 01/18/2017 Elsevier Interactive Patient Education  2019 [place] DASH stands for "Dietary Approaches to Stop Hypertension." The DASH eating plan is a healthy eating plan that has been shown to reduce high blood pressure (hypertension). It may also reduce  your risk for type 2 diabetes, heart disease, and stroke. The DASH eating plan may also help with weight loss. What are tips for following this plan?  General guidelines  Avoid eating more than 2,300 mg (milligrams) of salt (sodium) a day. If you have hypertension, you may need to reduce your sodium intake to 1,500 mg a day.  Limit alcohol intake to no more than 1 drink a day for nonpregnant women and 2 drinks a day for men. One drink equals 12 oz of beer, 5 oz of wine, or 1 oz of hard liquor.  Work with your health care provider to maintain a healthy body weight or to lose weight. Ask what an ideal weight is for you.  Get at least 30 minutes of exercise that causes your heart to beat faster (aerobic exercise) most days of the week. Activities may include walking, swimming, or biking.  Work  with your health care provider or diet and nutrition specialist (dietitian) to adjust your eating plan to your individual calorie needs. Reading food labels   Check food labels for the amount of sodium per serving. Choose foods with less than 5 percent of the Daily Value of sodium. Generally, foods with less than 300 mg of sodium per serving fit into this eating plan.  To find whole grains, look for the word "whole" as the first word in the ingredient list. Shopping  Buy products labeled as "low-sodium" or "no salt added."  Buy fresh foods. Avoid canned foods and premade or frozen meals. Cooking  Avoid adding salt when cooking. Use salt-free seasonings or herbs instead of table salt or sea salt. Check with your health care provider or pharmacist before using salt substitutes.  Do not fry foods. Cook foods using healthy methods such as baking, boiling, grilling, and broiling instead.  Cook with heart-healthy oils, such as olive, canola, soybean, or sunflower oil. Meal planning  Eat a balanced diet that includes: ? 5 or more servings of fruits and vegetables each day. At each meal, try to fill half of your plate with fruits and vegetables. ? Up to 6-8 servings of whole grains each day. ? Less than 6 oz of lean meat, poultry, or fish each day. A 3-oz serving of meat is about the same size as a deck of cards. One egg equals 1 oz. ? 2 servings of low-fat dairy each day. ? A serving of nuts, seeds, or beans 5 times each week. ? Heart-healthy fats. Healthy fats called Omega-3 fatty acids are found in foods such as flaxseeds and coldwater fish, like sardines, salmon, and mackerel.  Limit how much you eat of the following: ? Canned or prepackaged foods. ? Food that is high in trans fat, such as fried foods. ? Food that is high in saturated fat, such as fatty meat. ? Sweets, desserts, sugary drinks, and other foods with added sugar. ? Full-fat dairy products.  Do not salt foods before  eating.  Try to eat at least 2 vegetarian meals each week.  Eat more home-cooked food and less restaurant, buffet, and fast food.  When eating at a restaurant, ask that your food be prepared with less salt or no salt, if possible. What foods are recommended? The items listed may not be a complete list. Talk with your dietitian about what dietary choices are best for you. Grains Whole-grain or whole-wheat bread. Whole-grain or whole-wheat pasta. Brown rice. [place]. Bulgur. Whole-grain and low-sodium cereals. Pita bread. Low-fat, low-sodium crackers. Whole-wheat flour tortillas.  Vegetables Fresh or frozen vegetables (raw, steamed, roasted, or grilled). Low-sodium or reduced-sodium tomato and vegetable juice. Low-sodium or reduced-sodium tomato sauce and tomato paste. Low-sodium or reduced-sodium canned vegetables. Fruits All fresh, dried, or frozen fruit. Canned fruit in natural juice (without added sugar). Meat and other protein foods Skinless chicken or [place]. Ground chicken or [place]. Pork with fat trimmed off. Fish and seafood. Egg whites. Dried beans, peas, or lentils. Unsalted nuts, nut butters, and seeds. Unsalted canned beans. Lean cuts of beef with fat trimmed off. Low-sodium, lean deli meat. Dairy Low-fat (1%) or fat-free (skim) milk. Fat-free, low-fat, or reduced-fat cheeses. Nonfat, low-sodium ricotta or cottage cheese. Low-fat or nonfat yogurt. Low-fat, low-sodium cheese. Fats and oils Soft margarine without trans fats. Vegetable oil. Low-fat, reduced-fat, or light mayonnaise and salad dressings (reduced-sodium). Canola, safflower, olive, soybean, and sunflower oils. Avocado. Seasoning and other foods Herbs. Spices. Seasoning mixes without salt. Unsalted popcorn and pretzels. Fat-free sweets. What foods are not recommended? The items listed may not be a complete list. Talk with your dietitian about what dietary choices are best for you. Grains Baked goods made with fat,  such as croissants, muffins, or some breads. Dry pasta or rice meal packs. Vegetables Creamed or fried vegetables. Vegetables in a cheese sauce. Regular canned vegetables (not low-sodium or reduced-sodium). Regular canned tomato sauce and paste (not low-sodium or reduced-sodium). Regular tomato and vegetable juice (not low-sodium or reduced-sodium). Angie Fava. Olives. Fruits Canned fruit in a light or heavy syrup. Fried fruit. Fruit in cream or butter sauce. Meat and other protein foods Fatty cuts of meat. Ribs. Fried meat. Berniece Salines. Sausage. Bologna and other processed lunch meats. Salami. Fatback. Hotdogs. Bratwurst. Salted nuts and seeds. Canned beans with added salt. Canned or smoked fish. Whole eggs or egg yolks. Chicken or [place] with skin. Dairy Whole or 2% milk, cream, and half-and-half. Whole or full-fat cream cheese. Whole-fat or sweetened yogurt. Full-fat cheese. Nondairy creamers. Whipped toppings. Processed cheese and cheese spreads. Fats and oils Butter. Stick margarine. Lard. Shortening. Ghee. Bacon fat. Tropical oils, such as coconut, palm kernel, or palm oil. Seasoning and other foods Salted popcorn and pretzels. Onion salt, garlic salt, seasoned salt, table salt, and sea salt. Worcestershire sauce. Tartar sauce. Barbecue sauce. Teriyaki sauce. Soy sauce, including reduced-sodium. Steak sauce. Canned and packaged gravies. Fish sauce. Oyster sauce. Cocktail sauce. Horseradish that you find on the shelf. Ketchup. Mustard. Meat flavorings and tenderizers. Bouillon cubes. Hot sauce and Tabasco sauce. Premade or packaged marinades. Premade or packaged taco seasonings. Relishes. Regular salad dressings. Where to find more information:  National Heart, Lung, and [place]: https://wilson-eaton.com/  American Heart Association: www.heart.org Summary  The DASH eating plan is a healthy eating plan that has been shown to reduce high blood pressure (hypertension). It may also reduce your risk for  type 2 diabetes, heart disease, and stroke.  With the DASH eating plan, you should limit salt (sodium) intake to 2,300 mg a day. If you have hypertension, you may need to reduce your sodium intake to 1,500 mg a day.  When on the DASH eating plan, aim to eat more fresh fruits and vegetables, whole grains, lean proteins, low-fat dairy, and heart-healthy fats.  Work with your health care provider or diet and nutrition specialist (dietitian) to adjust your eating plan to your individual calorie needs. This information is not intended to replace advice given to you by your health care provider. Make sure you discuss any questions you have with your health care provider. Document  Released: 12/03/2011 Document Revised: 12/07/2016 Document Reviewed: 12/07/2016 Elsevier Interactive Patient Education  2019 [employer]. Results for orders placed or performed in visit on 08/22/18  Hemoglobin A1c  Result Value Ref Range   Hgb A1c MFr Bld 6.2 (H) 4.8 - 5.6 %   Est. average glucose Bld gHb Est-mCnc 131 mg/dL  Lipid panel  Result Value Ref Range   Cholesterol, Total 213 (H) 100 - 199 mg/dL   Triglycerides 187 (H) 0 - 149 mg/dL   HDL 45 >39 mg/dL   VLDL Cholesterol Cal 37 5 - 40 mg/dL   LDL Calculated 131 (H) 0 - 99 mg/dL   Chol/HDL Ratio 4.7 0.0 - 5.0 ratio

## 2019-01-09 ENCOUNTER — Encounter: Payer: Self-pay | Admitting: Family Medicine

## 2019-04-06 ENCOUNTER — Other Ambulatory Visit: Payer: Self-pay | Admitting: Family Medicine

## 2019-06-20 ENCOUNTER — Other Ambulatory Visit: Payer: Self-pay | Admitting: Family Medicine

## 2019-06-21 NOTE — Telephone Encounter (Signed)
Schedule office visit or virtual visit for July Then may have refills

## 2019-06-23 NOTE — Telephone Encounter (Signed)
Please schedule and then send back to nurses to refill med

## 2019-06-26 NOTE — Telephone Encounter (Signed)
LVM TO SCHEDULE VIRTUAL VISIT

## 2019-07-07 ENCOUNTER — Ambulatory Visit: Payer: 59 | Admitting: Family Medicine

## 2019-07-18 ENCOUNTER — Other Ambulatory Visit: Payer: Self-pay

## 2019-07-18 ENCOUNTER — Ambulatory Visit (INDEPENDENT_AMBULATORY_CARE_PROVIDER_SITE_OTHER): Payer: 59 | Admitting: Family Medicine

## 2019-07-18 DIAGNOSIS — R7303 Prediabetes: Secondary | ICD-10-CM | POA: Diagnosis not present

## 2019-07-18 DIAGNOSIS — D72829 Elevated white blood cell count, unspecified: Secondary | ICD-10-CM

## 2019-07-18 DIAGNOSIS — I10 Essential (primary) hypertension: Secondary | ICD-10-CM | POA: Diagnosis not present

## 2019-07-18 DIAGNOSIS — Z114 Encounter for screening for human immunodeficiency virus [HIV]: Secondary | ICD-10-CM | POA: Diagnosis not present

## 2019-07-18 MED ORDER — LISINOPRIL 10 MG PO TABS: ORAL_TABLET | ORAL | 5 refills | Status: DC

## 2019-07-18 MED ORDER — CITALOPRAM HYDROBROMIDE 20 MG PO TABS: ORAL_TABLET | ORAL | 5 refills | Status: DC

## 2019-07-18 NOTE — Progress Notes (Signed)
   Subjective:    Patient ID: Frank Flores, male    DOB: 07-Apr-1971, 48 y.o.   MRN: [id number]  Hypertension This is a chronic problem. The current episode started more than 1 year ago. Pertinent negatives include no chest pain, headaches or shortness of breath. Risk factors for coronary artery disease include male gender. Treatments tried: lisinopril. There are no compliance problems.    Patient working a lot of hours under a lot of stress but denies being depressed does well with the Celexa would like to continue it currently.  Denies being depressed currently Virtual Visit via Video Note  I connected with Frank Flores on 07/18/19 at  3:30 PM EDT by a video enabled telemedicine application and verified that I am speaking with the correct person using two identifiers.  Location: Patient: home Provider: office   I discussed the limitations of evaluation and management by telemedicine and the availability of in person appointments. The patient expressed understanding and agreed to proceed.  History of Present Illness:    Observations/Objective:   Assessment and Plan:   Follow Up Instructions:    I discussed the assessment and treatment plan with the patient. The patient was provided an opportunity to ask questions and all were answered. The patient agreed with the plan and demonstrated an understanding of the instructions.   The patient was advised to call back or seek an in-person evaluation if the symptoms worsen or if the condition fails to improve as anticipated.  I provided 15 minutes of non-face-to-face time during this encounter.       Review of Systems  Constitutional: Negative for diaphoresis and fatigue.  HENT: Negative for congestion and rhinorrhea.   Respiratory: Negative for cough and shortness of breath.   Cardiovascular: Negative for chest pain and leg swelling.  Gastrointestinal: Negative for abdominal pain and diarrhea.  Skin: Negative for color  change and rash.  Neurological: Negative for dizziness and headaches.  Psychiatric/Behavioral: Negative for behavioral problems and confusion.       Objective:   Physical Exam  Patient had virtual visit Appears to be in no distress Atraumatic Neuro able to relate and oriented No apparent resp distress Color normal       Assessment & Plan:  HTN- Patient was seen today as part of a visit regarding hypertension. The importance of healthy diet and regular physical activity was discussed. The importance of compliance with medications discussed.  Ideal goal is to keep blood pressure low elevated levels certainly below 177/93 when possible.  The patient was counseled that keeping blood pressure under control lessen his risk of complications.  The importance of regular follow-ups was discussed with the patient.  Low-salt diet such as DASH recommended.  Regular physical activity was recommended as well.  Patient was advised to keep regular follow-ups.  Stress related generalized anxiety-Depression doing well currently patient would like to continue with his Celexa.  He denies any problems with it.  He will follow-up later in August for a wellness exam and will do lab work before that visit

## 2019-07-19 ENCOUNTER — Encounter: Payer: Self-pay | Admitting: Family Medicine

## 2019-07-19 NOTE — Progress Notes (Signed)
Lab orders placed and mailed to patient  

## 2019-07-19 NOTE — Addendum Note (Signed)
Addended by: Dairl Ponder on: 07/19/2019 08:37 AM   Modules accepted: Orders

## 2019-07-19 NOTE — Addendum Note (Signed)
Addended by: Vicente Males on: 07/19/2019 09:16 AM   Modules accepted: Orders

## 2019-08-05 LAB — HEPATIC FUNCTION PANEL
ALT: 31 IU/L (ref 0–44)
AST: 27 IU/L (ref 0–40)
Albumin: 4.6 g/dL (ref 4.0–5.0)
Alkaline Phosphatase: 67 IU/L (ref 39–117)
Bilirubin Total: 0.4 mg/dL (ref 0.0–1.2)
Bilirubin, Direct: 0.11 mg/dL (ref 0.00–0.40)
Total Protein: 6.8 g/dL (ref 6.0–8.5)

## 2019-08-05 LAB — CBC WITH DIFFERENTIAL/PLATELET
Basophils Absolute: 0.1 10*3/uL (ref 0.0–0.2)
Basos: 1 %
EOS (ABSOLUTE): 0.3 10*3/uL (ref 0.0–0.4)
Eos: 4 %
Hematocrit: 41.6 % (ref 37.5–51.0)
Hemoglobin: 14.6 g/dL (ref 13.0–17.7)
Immature Grans (Abs): 0 10*3/uL (ref 0.0–0.1)
Immature Granulocytes: 0 %
Lymphocytes Absolute: 2.6 10*3/uL (ref 0.7–3.1)
Lymphs: 37 %
MCH: 30.4 pg (ref 26.6–33.0)
MCHC: 35.1 g/dL (ref 31.5–35.7)
MCV: 87 fL (ref 79–97)
Monocytes Absolute: 0.6 10*3/uL (ref 0.1–0.9)
Monocytes: 9 %
Neutrophils Absolute: 3.5 10*3/uL (ref 1.4–7.0)
Neutrophils: 49 %
Platelets: 335 10*3/uL (ref 150–450)
RBC: 4.81 x10E6/uL (ref 4.14–5.80)
RDW: 12.4 % (ref 11.6–15.4)
WBC: 7 10*3/uL (ref 3.4–10.8)

## 2019-08-05 LAB — HEMOGLOBIN A1C
Est. average glucose Bld gHb Est-mCnc: 131 mg/dL
Hgb A1c MFr Bld: 6.2 % — ABNORMAL HIGH (ref 4.8–5.6)

## 2019-08-05 LAB — BASIC METABOLIC PANEL
BUN/Creatinine Ratio: 18 (ref 9–20)
BUN: 19 mg/dL (ref 6–24)
CO2: 25 mmol/L (ref 20–29)
Calcium: 9.9 mg/dL (ref 8.7–10.2)
Chloride: 101 mmol/L (ref 96–106)
Creatinine, Ser: 1.07 mg/dL (ref 0.76–1.27)
GFR calc Af Amer: 95 mL/min/{1.73_m2} (ref >59)
GFR calc non Af Amer: 82 mL/min/{1.73_m2} (ref >59)
Glucose: 107 mg/dL — ABNORMAL HIGH (ref 65–99)
Potassium: 5.2 mmol/L (ref 3.5–5.2)
Sodium: 138 mmol/L (ref 134–144)

## 2019-08-05 LAB — HIV ANTIBODY (ROUTINE TESTING W REFLEX): HIV Screen 4th Generation wRfx: NONREACTIVE

## 2019-08-05 LAB — LIPID PANEL
Chol/HDL Ratio: 4.7 ratio (ref 0.0–5.0)
Cholesterol, Total: 202 mg/dL — ABNORMAL HIGH (ref 100–199)
HDL: 43 mg/dL (ref >39)
LDL Calculated: 137 mg/dL — ABNORMAL HIGH (ref 0–99)
Triglycerides: 112 mg/dL (ref 0–149)
VLDL Cholesterol Cal: 22 mg/dL (ref 5–40)

## 2019-08-11 ENCOUNTER — Encounter: Payer: Self-pay | Admitting: Family Medicine

## 2019-08-11 ENCOUNTER — Other Ambulatory Visit: Payer: Self-pay

## 2019-08-11 ENCOUNTER — Ambulatory Visit (INDEPENDENT_AMBULATORY_CARE_PROVIDER_SITE_OTHER): Payer: 59 | Admitting: Family Medicine

## 2019-08-11 VITALS — BP 114/74 | Temp 98.2°F | Ht 71.0 in | Wt 240.2 lb

## 2019-08-11 DIAGNOSIS — Z0000 Encounter for general adult medical examination without abnormal findings: Secondary | ICD-10-CM

## 2019-08-11 DIAGNOSIS — Z566 Other physical and mental strain related to work: Secondary | ICD-10-CM | POA: Diagnosis not present

## 2019-08-11 DIAGNOSIS — R7303 Prediabetes: Secondary | ICD-10-CM | POA: Diagnosis not present

## 2019-08-11 NOTE — Progress Notes (Signed)
Subjective:    Patient ID: Frank Flores, male    DOB: 1971/11/28, 48 y.o.   MRN: [id number]  HPI The patient comes in today for a wellness visit.    A review of their health history was completed.  A review of medications was also completed.  Any needed refills; yes  Eating habits: been doing better  Falls/  MVA accidents in past few months: none  Regular exercise: works [employer] physical job  Specialist pt sees on regular basis: none  Preventative health issues were discussed.   Additional concerns: none  Patient doing a good job taking blood pressure medicine we did go over his lab work today including elevated LDL and A1c and the importance of diet exercise losing weight we did discuss risk and benefits of medicines hold off on medicines currently  Review of Systems  Constitutional: Negative for activity change, appetite change and fever.  HENT: Negative for congestion and rhinorrhea.   Eyes: Negative for discharge.  Respiratory: Negative for cough and wheezing.   Cardiovascular: Negative for chest pain.  Gastrointestinal: Negative for abdominal pain, blood in stool and vomiting.  Genitourinary: Negative for difficulty urinating and frequency.  Musculoskeletal: Negative for neck pain.  Skin: Negative for rash.  Allergic/Immunologic: Negative for environmental allergies and food allergies.  Neurological: Negative for weakness and headaches.  Psychiatric/Behavioral: Negative for agitation.       Objective:   Physical Exam Constitutional:      Appearance: He is well-developed.  HENT:     Head: Normocephalic and atraumatic.     Right Ear: External ear normal.     Left Ear: External ear normal.     Nose: Nose normal.  Eyes:     Pupils: Pupils are equal, round, and reactive to light.  Neck:     Musculoskeletal: Normal range of motion and neck supple.     Thyroid: No thyromegaly.  Cardiovascular:     Rate and Rhythm: Normal rate and regular rhythm.   Heart sounds: Normal heart sounds. No murmur.  Pulmonary:     Effort: Pulmonary effort is normal. No respiratory distress.     Breath sounds: Normal breath sounds. No wheezing.  Abdominal:     General: Bowel sounds are normal. There is no distension.     Palpations: Abdomen is soft. There is no mass.     Tenderness: There is no abdominal tenderness.  Genitourinary:    Penis: Normal.   Musculoskeletal: Normal range of motion.  Lymphadenopathy:     Cervical: No cervical adenopathy.  Skin:    General: Skin is warm and dry.     Findings: No erythema.  Neurological:     Mental Status: He is alert.     Motor: No abnormal muscle tone.  Psychiatric:        Behavior: Behavior normal.        Judgment: Judgment normal.   No prostate exam indicated        Assessment & Plan:  Adult wellness-complete.wellness physical was conducted today. Importance of diet and exercise were discussed in detail.  In addition to this a discussion regarding safety was also covered. We also reviewed over immunizations and gave recommendations regarding current immunization needed for age.  In addition to this additional areas were also touched on including: Preventative health exams needed:  Colonoscopy not indicated currently  Patient was advised yearly wellness exam  Recent lab work was completed and reviewed today  Prediabetes the importance of diet exercise bring weight  down will hold off on medications currently  Moods overall are doing well with citalopram he would like to continue this  Hyperlipidemia risk for heart disease 3% hold off on statins healthy diet regular activity  Follow-up for 6 months for checkup and labs

## 2019-08-11 NOTE — Patient Instructions (Signed)
Results for orders placed or performed in visit on 07/18/19  Lipid Profile  Result Value Ref Range   Cholesterol, Total 202 (H) 100 - 199 mg/dL   Triglycerides 112 0 - 149 mg/dL   HDL 43 >39 mg/dL   VLDL Cholesterol Cal 22 5 - 40 mg/dL   LDL Calculated 137 (H) 0 - 99 mg/dL   Chol/HDL Ratio 4.7 0.0 - 5.0 ratio  Hepatic function panel  Result Value Ref Range   Total Protein 6.8 6.0 - 8.5 g/dL   Albumin 4.6 4.0 - 5.0 g/dL   Bilirubin Total 0.4 0.0 - 1.2 mg/dL   Bilirubin, Direct 0.11 0.00 - 0.40 mg/dL   Alkaline Phosphatase 67 39 - 117 IU/L   AST 27 0 - 40 IU/L   ALT 31 0 - 44 IU/L  Hemoglobin A1c  Result Value Ref Range   Hgb A1c MFr Bld 6.2 (H) 4.8 - 5.6 %   Est. average glucose Bld gHb Est-mCnc 131 mg/dL  Basic Metabolic Panel (BMET)  Result Value Ref Range   Glucose 107 (H) 65 - 99 mg/dL   BUN 19 6 - 24 mg/dL   Creatinine, Ser 1.07 0.76 - 1.27 mg/dL   GFR calc non Af Amer 82 >59 mL/min/1.73   GFR calc Af Amer 95 >59 mL/min/1.73   BUN/Creatinine Ratio 18 9 - 20   Sodium 138 134 - 144 mmol/L   Potassium 5.2 3.5 - 5.2 mmol/L   Chloride 101 96 - 106 mmol/L   CO2 25 20 - 29 mmol/L   Calcium 9.9 8.7 - 10.2 mg/dL  CBC with Differential  Result Value Ref Range   WBC 7.0 3.4 - 10.8 x10E3/uL   RBC 4.81 4.14 - 5.80 x10E6/uL   Hemoglobin 14.6 13.0 - 17.7 g/dL   Hematocrit 41.6 37.5 - 51.0 %   MCV 87 79 - 97 fL   MCH 30.4 26.6 - 33.0 pg   MCHC 35.1 31.5 - 35.7 g/dL   RDW 12.4 11.6 - 15.4 %   Platelets 335 150 - 450 x10E3/uL   Neutrophils 49 Not Estab. %   Lymphs 37 Not Estab. %   Monocytes 9 Not Estab. %   Eos 4 Not Estab. %   Basos 1 Not Estab. %   Neutrophils Absolute 3.5 1.4 - 7.0 x10E3/uL   Lymphocytes Absolute 2.6 0.7 - 3.1 x10E3/uL   Monocytes Absolute 0.6 0.1 - 0.9 x10E3/uL   EOS (ABSOLUTE) 0.3 0.0 - 0.4 x10E3/uL   Basophils Absolute 0.1 0.0 - 0.2 x10E3/uL   Immature Granulocytes 0 Not Estab. %   Immature Grans (Abs) 0.0 0.0 - 0.1 x10E3/uL  HIV antibody (with  reflex)  Result Value Ref Range   HIV Screen 4th Generation wRfx Non Reactive Non Reactive

## 2019-11-04 ENCOUNTER — Other Ambulatory Visit: Payer: Self-pay | Admitting: Family Medicine

## 2019-11-15 ENCOUNTER — Other Ambulatory Visit: Payer: Self-pay

## 2019-11-15 DIAGNOSIS — Z20822 Contact with and (suspected) exposure to covid-19: Secondary | ICD-10-CM

## 2019-11-16 ENCOUNTER — Telehealth: Payer: Self-pay | Admitting: *Deleted

## 2019-11-16 NOTE — Telephone Encounter (Signed)
Calling for results. None as of yet.

## 2019-11-17 LAB — NOVEL CORONAVIRUS, NAA: SARS-CoV-2, NAA: DETECTED — AB

## 2020-04-03 ENCOUNTER — Other Ambulatory Visit: Payer: Self-pay | Admitting: Family Medicine

## 2020-04-03 NOTE — Telephone Encounter (Signed)
May have 30-day on medication needs follow-up visit in person or virtual

## 2020-04-03 NOTE — Telephone Encounter (Signed)
lvm to schedule visit

## 2020-04-04 NOTE — Telephone Encounter (Signed)
Patient scheduled an appt for 04/17/20 but needs the refills called in as soon as possible because he is out of meds.

## 2020-04-17 ENCOUNTER — Other Ambulatory Visit: Payer: Self-pay

## 2020-04-17 ENCOUNTER — Telehealth: Payer: Self-pay | Admitting: *Deleted

## 2020-04-17 ENCOUNTER — Telehealth (INDEPENDENT_AMBULATORY_CARE_PROVIDER_SITE_OTHER): Payer: 59 | Admitting: Family Medicine

## 2020-04-17 DIAGNOSIS — Z566 Other physical and mental strain related to work: Secondary | ICD-10-CM | POA: Diagnosis not present

## 2020-04-17 DIAGNOSIS — I10 Essential (primary) hypertension: Secondary | ICD-10-CM

## 2020-04-17 MED ORDER — LISINOPRIL 10 MG PO TABS: ORAL_TABLET | ORAL | 5 refills | Status: DC

## 2020-04-17 MED ORDER — CITALOPRAM HYDROBROMIDE 20 MG PO TABS: ORAL_TABLET | ORAL | 5 refills | Status: DC

## 2020-04-17 MED ORDER — ALBUTEROL SULFATE HFA 108 (90 BASE) MCG/ACT IN AERS: INHALATION_SPRAY | RESPIRATORY_TRACT | 5 refills | Status: DC

## 2020-04-17 NOTE — Telephone Encounter (Signed)
Frank Flores, Frank Flores are scheduled for a virtual visit with your provider today.    Just as we do with appointments in the office, we must obtain your consent to participate.  Your consent will be active for this visit and any virtual visit you may have with one of our providers in the next 365 days.    If you have a MyChart account, I can also send a copy of this consent to you electronically.  All virtual visits are billed to your insurance company just like a traditional visit in the office.  As this is a virtual visit, video technology does not allow for your provider to perform a traditional examination.  This may limit your provider's ability to fully assess your condition.  If your provider identifies any concerns that need to be evaluated in person or the need to arrange testing such as labs, EKG, etc, we will make arrangements to do so.    Although advances in technology are sophisticated, we cannot ensure that it will always work on either your end or our end.  If the connection with a video visit is poor, we may have to switch to a telephone visit.  With either a video or telephone visit, we are not always able to ensure that we have a secure connection.   I need to obtain your verbal consent now.   Are you willing to proceed with your visit today?   KOLTIN WEHMEYER has provided verbal consent on 04/17/2020 for a virtual visit (video or telephone).   Mitzie Na, RN 04/17/2020  12:04 PM

## 2020-04-17 NOTE — Progress Notes (Signed)
   Subjective:    Patient ID: Frank Flores, male    DOB: [birth year], 49 y.o.   MRN: [id number]  Hypertension This is a chronic problem. The current episode started more than 1 year ago. Pertinent negatives include no chest pain, headaches or shortness of breath. Risk factors for coronary artery disease include male gender. Treatments tried: lisinopril. There are no compliance problems.    Taking his meds Tolerating well Staying active Moods good   Review of Systems  Constitutional: Negative for activity change, fatigue and fever.  HENT: Negative for congestion and rhinorrhea.   Respiratory: Negative for cough and shortness of breath.   Cardiovascular: Negative for chest pain and leg swelling.  Gastrointestinal: Negative for abdominal pain, diarrhea and nausea.  Genitourinary: Negative for dysuria and hematuria.  Neurological: Negative for weakness and headaches.  Psychiatric/Behavioral: Negative for agitation and behavioral problems.    Virtual Visit via Video Note  I connected with Frank Flores on 04/17/20 at  2:00 PM EDT by a video enabled telemedicine application and verified that I am speaking with the correct person using two identifiers.  Location: Patient: home Provider: office   I discussed the limitations of evaluation and management by telemedicine and the availability of in person appointments. The patient expressed understanding and agreed to proceed.  History of Present Illness:    Observations/Objective:   Assessment and Plan:   Follow Up Instructions:    I discussed the assessment and treatment plan with the patient. The patient was provided an opportunity to ask questions and all were answered. The patient agreed with the plan and demonstrated an understanding of the instructions.   The patient was advised to call back or seek an in-person evaluation if the symptoms worsen or if the condition fails to improve as anticipated.  I provided 20 minutes  of non-face-to-face time during this encounter.        Objective:   Physical Exam Vitals reviewed.  Constitutional:      General: He is not in acute distress. HENT:     Head: Normocephalic and atraumatic.  Eyes:     General:        Right eye: No discharge.        Left eye: No discharge.  Neck:     Trachea: No tracheal deviation.  Cardiovascular:     Rate and Rhythm: Normal rate and regular rhythm.     Heart sounds: Normal heart sounds. No murmur.  Pulmonary:     Effort: Pulmonary effort is normal. No respiratory distress.     Breath sounds: Normal breath sounds.  Lymphadenopathy:     Cervical: No cervical adenopathy.  Skin:    General: Skin is warm and dry.  Neurological:     Mental Status: He is alert.     Coordination: Coordination normal.  Psychiatric:        Behavior: Behavior normal.           Assessment & Plan:  1. Essential hypertension Reported good control watch diet continue meds refills sent in  2. Stress at work Continue med  Reactive airway prn albuterol  Wellness in 4 to 6 months with labs  Wellness august or sept and labs

## 2020-06-03 ENCOUNTER — Telehealth: Payer: Self-pay | Admitting: Family Medicine

## 2020-06-03 MED ORDER — ALBUTEROL SULFATE HFA 108 (90 BASE) MCG/ACT IN AERS: INHALATION_SPRAY | RESPIRATORY_TRACT | 3 refills | Status: DC

## 2020-06-03 NOTE — Telephone Encounter (Signed)
Pt contacted and verbalized understanding.  

## 2020-06-03 NOTE — Telephone Encounter (Signed)
Pt returned call and advised that inhaler was sent into pharmacy.  Pt had first vaccine shot on 05/31/2020 and is scheduled to have 2nd shot on 06/28/20. Pt states that he is going to be out of town for a few days and wants to know if he can postpone 2nd shot until around 07/05/20. Please advise. Thank you.

## 2020-06-03 NOTE — Telephone Encounter (Signed)
May have  generic albuterol.  2 puffs every 4 hours as needed, 1 inhaler, 3 refills

## 2020-06-03 NOTE — Telephone Encounter (Signed)
Patient is requesting generic brand ventolin because pharmacy is out per pharmacy she checked with patient and he states ok. Please advise Joanna

## 2020-06-03 NOTE — Telephone Encounter (Signed)
Yes he can

## 2020-06-03 NOTE — Telephone Encounter (Signed)
Inhaler sent in to pharmacy. Left message to return call

## 2020-06-03 NOTE — Telephone Encounter (Signed)
Please advise. Thank you

## 2020-11-07 ENCOUNTER — Other Ambulatory Visit: Payer: Self-pay | Admitting: *Deleted

## 2020-11-07 MED ORDER — ALBUTEROL SULFATE HFA 108 (90 BASE) MCG/ACT IN AERS: INHALATION_SPRAY | RESPIRATORY_TRACT | 0 refills | Status: DC

## 2020-12-04 ENCOUNTER — Encounter: Payer: Self-pay | Admitting: Family Medicine

## 2020-12-04 ENCOUNTER — Ambulatory Visit: Payer: 59 | Admitting: Family Medicine

## 2020-12-04 ENCOUNTER — Other Ambulatory Visit: Payer: Self-pay

## 2020-12-04 ENCOUNTER — Ambulatory Visit (INDEPENDENT_AMBULATORY_CARE_PROVIDER_SITE_OTHER): Payer: 59 | Admitting: Family Medicine

## 2020-12-04 VITALS — BP 120/86 | HR 96 | Temp 97.3°F | Wt 254.8 lb

## 2020-12-04 DIAGNOSIS — Z13220 Encounter for screening for lipoid disorders: Secondary | ICD-10-CM

## 2020-12-04 DIAGNOSIS — I10 Essential (primary) hypertension: Secondary | ICD-10-CM

## 2020-12-04 DIAGNOSIS — J4540 Moderate persistent asthma, uncomplicated: Secondary | ICD-10-CM

## 2020-12-04 DIAGNOSIS — Z0000 Encounter for general adult medical examination without abnormal findings: Secondary | ICD-10-CM

## 2020-12-04 DIAGNOSIS — R7303 Prediabetes: Secondary | ICD-10-CM | POA: Diagnosis not present

## 2020-12-04 DIAGNOSIS — R5383 Other fatigue: Secondary | ICD-10-CM | POA: Insufficient documentation

## 2020-12-04 DIAGNOSIS — F320 Major depressive disorder, single episode, mild: Secondary | ICD-10-CM

## 2020-12-04 MED ORDER — LISINOPRIL 10 MG PO TABS: ORAL_TABLET | ORAL | 5 refills | Status: DC

## 2020-12-04 MED ORDER — BUDESONIDE-FORMOTEROL FUMARATE 80-4.5 MCG/ACT IN AERO: 2.0000 | INHALATION_SPRAY | Freq: Two times a day (BID) | RESPIRATORY_TRACT | 0 refills | Status: DC

## 2020-12-04 MED ORDER — CITALOPRAM HYDROBROMIDE 20 MG PO TABS: ORAL_TABLET | ORAL | 5 refills | Status: DC

## 2020-12-04 NOTE — Patient Instructions (Signed)
Encourage to take blood pressure occasionally. Encouraged to watch sodium.     DASH Eating Plan DASH stands for "Dietary Approaches to Stop Hypertension." The DASH eating plan is a healthy eating plan that has been shown to reduce high blood pressure (hypertension). It may also reduce your risk for type 2 diabetes, heart disease, and stroke. The DASH eating plan may also help with weight loss. What are tips for following this plan?  General guidelines  Avoid eating more than 2,300 mg (milligrams) of salt (sodium) a day. If you have hypertension, you may need to reduce your sodium intake to 1,500 mg a day.  Limit alcohol intake to no more than 1 drink a day for nonpregnant women and 2 drinks a day for men. One drink equals 12 oz of beer, 5 oz of wine, or 1 oz of hard liquor.  Work with your health care provider to maintain a healthy body weight or to lose weight. Ask what an ideal weight is for you.  Get at least 30 minutes of exercise that causes your heart to beat faster (aerobic exercise) most days of the week. Activities may include walking, swimming, or biking.  Work with your health care provider or diet and nutrition specialist (dietitian) to adjust your eating plan to your individual calorie needs. Reading food labels   Check food labels for the amount of sodium per serving. Choose foods with less than 5 percent of the Daily Value of sodium. Generally, foods with less than 300 mg of sodium per serving fit into this eating plan.  To find whole grains, look for the word "whole" as the first word in the ingredient list. Shopping  Buy products labeled as "low-sodium" or "no salt added."  Buy fresh foods. Avoid canned foods and premade or frozen meals. Cooking  Avoid adding salt when cooking. Use salt-free seasonings or herbs instead of table salt or sea salt. Check with your health care provider or pharmacist before using salt substitutes.  Do not fry foods. Cook foods using  healthy methods such as baking, boiling, grilling, and broiling instead.  Cook with heart-healthy oils, such as olive, canola, soybean, or sunflower oil. Meal planning  Eat a balanced diet that includes: ? 5 or more servings of fruits and vegetables each day. At each meal, try to fill half of your plate with fruits and vegetables. ? Up to 6-8 servings of whole grains each day. ? Less than 6 oz of lean meat, poultry, or fish each day. A 3-oz serving of meat is about the same size as a deck of cards. One egg equals 1 oz. ? 2 servings of low-fat dairy each day. ? A serving of nuts, seeds, or beans 5 times each week. ? Heart-healthy fats. Healthy fats called Omega-3 fatty acids are found in foods such as flaxseeds and coldwater fish, like sardines, salmon, and mackerel.  Limit how much you eat of the following: ? Canned or prepackaged foods. ? Food that is high in trans fat, such as fried foods. ? Food that is high in saturated fat, such as fatty meat. ? Sweets, desserts, sugary drinks, and other foods with added sugar. ? Full-fat dairy products.  Do not salt foods before eating.  Try to eat at least 2 vegetarian meals each week.  Eat more home-cooked food and less restaurant, buffet, and fast food.  When eating at a restaurant, ask that your food be prepared with less salt or no salt, if possible. What foods are recommended?  The items listed may not be a complete list. Talk with your dietitian about what dietary choices are best for you. Grains Whole-grain or whole-wheat bread. Whole-grain or whole-wheat pasta. Brown rice. [place]. Bulgur. Whole-grain and low-sodium cereals. Pita bread. Low-fat, low-sodium crackers. Whole-wheat flour tortillas. Vegetables Fresh or frozen vegetables (raw, steamed, roasted, or grilled). Low-sodium or reduced-sodium tomato and vegetable juice. Low-sodium or reduced-sodium tomato sauce and tomato paste. Low-sodium or reduced-sodium canned  vegetables. Fruits All fresh, dried, or frozen fruit. Canned fruit in natural juice (without added sugar). Meat and other protein foods Skinless chicken or [place]. Ground chicken or [place]. Pork with fat trimmed off. Fish and seafood. Egg whites. Dried beans, peas, or lentils. Unsalted nuts, nut butters, and seeds. Unsalted canned beans. Lean cuts of beef with fat trimmed off. Low-sodium, lean deli meat. Dairy Low-fat (1%) or fat-free (skim) milk. Fat-free, low-fat, or reduced-fat cheeses. Nonfat, low-sodium ricotta or cottage cheese. Low-fat or nonfat yogurt. Low-fat, low-sodium cheese. Fats and oils Soft margarine without trans fats. Vegetable oil. Low-fat, reduced-fat, or light mayonnaise and salad dressings (reduced-sodium). Canola, safflower, olive, soybean, and sunflower oils. Avocado. Seasoning and other foods Herbs. Spices. Seasoning mixes without salt. Unsalted popcorn and pretzels. Fat-free sweets. What foods are not recommended? The items listed may not be a complete list. Talk with your dietitian about what dietary choices are best for you. Grains Baked goods made with fat, such as croissants, muffins, or some breads. Dry pasta or rice meal packs. Vegetables Creamed or fried vegetables. Vegetables in a cheese sauce. Regular canned vegetables (not low-sodium or reduced-sodium). Regular canned tomato sauce and paste (not low-sodium or reduced-sodium). Regular tomato and vegetable juice (not low-sodium or reduced-sodium). Angie Fava. Olives. Fruits Canned fruit in a light or heavy syrup. Fried fruit. Fruit in cream or butter sauce. Meat and other protein foods Fatty cuts of meat. Ribs. Fried meat. Berniece Salines. Sausage. Bologna and other processed lunch meats. Salami. Fatback. Hotdogs. Bratwurst. Salted nuts and seeds. Canned beans with added salt. Canned or smoked fish. Whole eggs or egg yolks. Chicken or [place] with skin. Dairy Whole or 2% milk, cream, and half-and-half. Whole or full-fat  cream cheese. Whole-fat or sweetened yogurt. Full-fat cheese. Nondairy creamers. Whipped toppings. Processed cheese and cheese spreads. Fats and oils Butter. Stick margarine. Lard. Shortening. Ghee. Bacon fat. Tropical oils, such as coconut, palm kernel, or palm oil. Seasoning and other foods Salted popcorn and pretzels. Onion salt, garlic salt, seasoned salt, table salt, and sea salt. Worcestershire sauce. Tartar sauce. Barbecue sauce. Teriyaki sauce. Soy sauce, including reduced-sodium. Steak sauce. Canned and packaged gravies. Fish sauce. Oyster sauce. Cocktail sauce. Horseradish that you find on the shelf. Ketchup. Mustard. Meat flavorings and tenderizers. Bouillon cubes. Hot sauce and Tabasco sauce. Premade or packaged marinades. Premade or packaged taco seasonings. Relishes. Regular salad dressings. Where to find more information:  National Heart, Lung, and [place]: https://wilson-eaton.com/  American Heart Association: www.heart.org Summary  The DASH eating plan is a healthy eating plan that has been shown to reduce high blood pressure (hypertension). It may also reduce your risk for type 2 diabetes, heart disease, and stroke.  With the DASH eating plan, you should limit salt (sodium) intake to 2,300 mg a day. If you have hypertension, you may need to reduce your sodium intake to 1,500 mg a day.  When on the DASH eating plan, aim to eat more fresh fruits and vegetables, whole grains, lean proteins, low-fat dairy, and heart-healthy fats.  Work with your health care  provider or diet and nutrition specialist (dietitian) to adjust your eating plan to your individual calorie needs. This information is not intended to replace advice given to you by your health care provider. Make sure you discuss any questions you have with your health care provider. Document Revised: 11/26/2017 Document Reviewed: 12/07/2016 Elsevier Patient Education  2020 [employer].

## 2020-12-04 NOTE — Progress Notes (Signed)
Patient ID: Frank Flores, male    DOB: 1971/04/05, 49 y.o.   MRN: [id number]   Chief Complaint  Patient presents with  . Hypertension   Subjective:  CC: annual wellness exam  Presents today for annual wellness exam.  Has a history of asthma, hypertension, prediabetes, and depression/needs to be calm down.  He reports no concerns for his health today he has not had labs in over a year.  Denies fever, chills, shortness of breath, chest pain.  Needs medication management, refills today.  The patient comes in today for a wellness visit.    A review of their health history was completed.  A review of medications was also completed.  Any needed refills; celexa and lisinopril  Eating habits: pretty good  Falls/  MVA accidents in past few months: none  Regular exercise: active at work   Specialist pt sees on regular basis: none  Preventative health issues were discussed.   Additional concerns: pt has been feeling fatigued and less energetic past couple of months.   Medical History Jarreau has a past medical history of Allergy, Asthma, and Hypertension.   Outpatient Encounter Medications as of 12/04/2020  Medication Sig  . albuterol (VENTOLIN HFA) 108 (90 Base) MCG/ACT inhaler Inhale 2 puffs every 4 hours prn  . budesonide-formoterol (SYMBICORT) 80-4.5 MCG/ACT inhaler Inhale 2 puffs into the lungs 2 (two) times daily.  . citalopram (CELEXA) 20 MG tablet TAKE ONE (1) TABLET BY MOUTH EVERY DAY  . lisinopril (ZESTRIL) 10 MG tablet TAKE ONE TABLET (10MG TOTAL) BY MOUTH DAILY  . [DISCONTINUED] albuterol (VENTOLIN HFA) 108 (90 Base) MCG/ACT inhaler INHALE TWO PUFFS INTO THE LUNGS EVERY FOUR HOURS AS NEEDED FOR SHORTNESS OF BREATH OR WHEEZING  . [DISCONTINUED] citalopram (CELEXA) 20 MG tablet TAKE ONE (1) TABLET BY MOUTH EVERY DAY  . [DISCONTINUED] lisinopril (ZESTRIL) 10 MG tablet TAKE ONE TABLET (10MG TOTAL) BY MOUTH DAILY   No facility-administered encounter medications on file as  of 12/04/2020.     Review of Systems  Constitutional: Positive for fatigue. Negative for chills and fever.       Works lot, feels tired.  HENT: Negative for congestion and ear pain.   Respiratory: Negative for cough, chest tightness and shortness of breath.   Cardiovascular: Negative for chest pain, palpitations and leg swelling.  Gastrointestinal: Negative for abdominal pain, blood in stool, constipation, diarrhea, nausea and vomiting.  Genitourinary: Negative for difficulty urinating.  Musculoskeletal: Negative for back pain and joint swelling.  Skin: Negative for rash.  Neurological: Positive for headaches. Negative for dizziness, weakness and numbness.       Occ migraine. Not new  Hematological: Negative for adenopathy.  Psychiatric/Behavioral: Positive for sleep disturbance. Negative for self-injury and suicidal ideas. The patient is not nervous/anxious.        Work related sleep distubance.     Vitals BP 120/86   Pulse 96   Temp (!) 97.3 F (36.3 C)   Wt 254 lb 12.8 oz (115.6 kg)   SpO2 97%   BMI 35.54 kg/m   Objective:   Physical Exam Vitals and nursing note reviewed.  Constitutional:      Appearance: Normal appearance.  HENT:     Head: Normocephalic.     Right Ear: Tympanic membrane normal.     Left Ear: Tympanic membrane normal.     Nose: Nose normal.     Mouth/Throat:     Mouth: Mucous membranes are moist.     Pharynx: Oropharynx is  clear.  Eyes:     Extraocular Movements: Extraocular movements intact.     Pupils: Pupils are equal, round, and reactive to light.  Cardiovascular:     Rate and Rhythm: Normal rate and regular rhythm.     Heart sounds: Normal heart sounds.  Pulmonary:     Effort: Pulmonary effort is normal.     Breath sounds: Normal breath sounds.  Abdominal:     General: Bowel sounds are normal.     Palpations: Abdomen is soft.  Musculoskeletal:        General: Normal range of motion.     Cervical back: Normal range of motion.   Skin:    General: Skin is warm and dry.  Neurological:     General: No focal deficit present.     Mental Status: He is alert and oriented to person, place, and time.  Psychiatric:        Mood and Affect: Mood normal.        Behavior: Behavior normal.      Assessment and Plan   1. Prediabetes - HgB A1c  2. Wellness examination - Comprehensive Metabolic Panel (CMET)  3. Screening for hyperlipidemia - Lipid Profile  4. Essential hypertension - Comprehensive Metabolic Panel (CMET) - lisinopril (ZESTRIL) 10 MG tablet; TAKE ONE TABLET (10MG TOTAL) BY MOUTH DAILY  Dispense: 30 tablet; Refill: 5  5. Fatigue, unspecified type - CBC with Differential  6. Depression, major, single episode, mild (HCC) - citalopram (CELEXA) 20 MG tablet; TAKE ONE (1) TABLET BY MOUTH EVERY DAY  Dispense: 30 tablet; Refill: 5  7. Moderate persistent asthma without complication - budesonide-formoterol (SYMBICORT) 80-4.5 MCG/ACT inhaler; Inhale 2 puffs into the lungs 2 (two) times daily.  Dispense: 1 each; Refill: 0   Asthma: Reports that he uses albuterol approximately 15 times per month.  Discussion concerning inadequate control of his asthma.  Reports that he may go 3 weeks without using it and then depending on the weather he may use it frequently will add ICS.  He agrees to try Symbicort, and let me know if his symptoms are better controlled.  Essential hypertension: Discussed sodium restriction, he is compliant with his medication.  Encouraged him to occasionally take his blood pressure.  Today he was 120/86.  Encouraged physical exercise and lots of fruits and vegetables.  Continue current medication regimen.  Depression: Reports that he is not really depressed, just needs the medication to "calm down" and feels that he is well controlled with this PHQ-9 score today is 5, GAD-7 score 4.  Feels well denies any thoughts of self-harm.  Continue current medication regimen.  Last labs performed over a  year ago, will get routine lab work on the way out today.  We will notify him once results are available.  Agrees with plan of care discussed today. Understands warning signs to seek further care: Chest pain, shortness of breath, any significant change in health status.  Understands to follow-up in 6 months for medication management for asthma, hypertension, prediabetes and depression, sooner if anything changes.  Pecolia Ades, FNP-C

## 2020-12-05 LAB — COMPREHENSIVE METABOLIC PANEL
ALT: 39 IU/L (ref 0–44)
AST: 30 IU/L (ref 0–40)
Albumin/Globulin Ratio: 2 (ref 1.2–2.2)
Albumin: 4.8 g/dL (ref 4.0–5.0)
Alkaline Phosphatase: 80 IU/L (ref 44–121)
BUN/Creatinine Ratio: 12 (ref 9–20)
BUN: 13 mg/dL (ref 6–24)
Bilirubin Total: 0.5 mg/dL (ref 0.0–1.2)
CO2: 24 mmol/L (ref 20–29)
Calcium: 10.3 mg/dL — ABNORMAL HIGH (ref 8.7–10.2)
Chloride: 100 mmol/L (ref 96–106)
Creatinine, Ser: 1.06 mg/dL (ref 0.76–1.27)
GFR calc Af Amer: 95 mL/min/{1.73_m2} (ref >59)
GFR calc non Af Amer: 83 mL/min/{1.73_m2} (ref >59)
Globulin, Total: 2.4 g/dL (ref 1.5–4.5)
Glucose: 124 mg/dL — ABNORMAL HIGH (ref 65–99)
Potassium: 4.7 mmol/L (ref 3.5–5.2)
Sodium: 139 mmol/L (ref 134–144)
Total Protein: 7.2 g/dL (ref 6.0–8.5)

## 2020-12-05 LAB — LIPID PANEL
Chol/HDL Ratio: 5.4 ratio — ABNORMAL HIGH (ref 0.0–5.0)
Cholesterol, Total: 236 mg/dL — ABNORMAL HIGH (ref 100–199)
HDL: 44 mg/dL (ref >39)
LDL Chol Calc (NIH): 167 mg/dL — ABNORMAL HIGH (ref 0–99)
Triglycerides: 138 mg/dL (ref 0–149)
VLDL Cholesterol Cal: 25 mg/dL (ref 5–40)

## 2020-12-05 LAB — CBC WITH DIFFERENTIAL/PLATELET
Basophils Absolute: 0.1 10*3/uL (ref 0.0–0.2)
Basos: 1 %
EOS (ABSOLUTE): 0.2 10*3/uL (ref 0.0–0.4)
Eos: 2 %
Hematocrit: 44.4 % (ref 37.5–51.0)
Hemoglobin: 15.2 g/dL (ref 13.0–17.7)
Immature Grans (Abs): 0 10*3/uL (ref 0.0–0.1)
Immature Granulocytes: 0 %
Lymphocytes Absolute: 2.5 10*3/uL (ref 0.7–3.1)
Lymphs: 32 %
MCH: 30.3 pg (ref 26.6–33.0)
MCHC: 34.2 g/dL (ref 31.5–35.7)
MCV: 89 fL (ref 79–97)
Monocytes Absolute: 0.6 10*3/uL (ref 0.1–0.9)
Monocytes: 7 %
Neutrophils Absolute: 4.5 10*3/uL (ref 1.4–7.0)
Neutrophils: 58 %
Platelets: 345 10*3/uL (ref 150–450)
RBC: 5.01 x10E6/uL (ref 4.14–5.80)
RDW: 12.7 % (ref 11.6–15.4)
WBC: 7.9 10*3/uL (ref 3.4–10.8)

## 2020-12-05 LAB — HEMOGLOBIN A1C
Est. average glucose Bld gHb Est-mCnc: 146 mg/dL
Hgb A1c MFr Bld: 6.7 % — ABNORMAL HIGH (ref 4.8–5.6)

## 2020-12-13 ENCOUNTER — Telehealth: Payer: Self-pay | Admitting: *Deleted

## 2020-12-13 NOTE — Telephone Encounter (Signed)
Error

## 2021-01-08 ENCOUNTER — Encounter: Payer: Self-pay | Admitting: Family Medicine

## 2021-01-08 ENCOUNTER — Ambulatory Visit: Payer: 59 | Admitting: Family Medicine

## 2021-01-08 ENCOUNTER — Other Ambulatory Visit: Payer: Self-pay

## 2021-01-08 VITALS — BP 114/78 | HR 95 | Temp 98.1°F | Ht 71.0 in | Wt 256.0 lb

## 2021-01-08 DIAGNOSIS — R7303 Prediabetes: Secondary | ICD-10-CM | POA: Diagnosis not present

## 2021-01-08 DIAGNOSIS — E7849 Other hyperlipidemia: Secondary | ICD-10-CM | POA: Diagnosis not present

## 2021-01-08 DIAGNOSIS — I10 Essential (primary) hypertension: Secondary | ICD-10-CM | POA: Diagnosis not present

## 2021-01-08 DIAGNOSIS — Z79899 Other long term (current) drug therapy: Secondary | ICD-10-CM

## 2021-01-08 MED ORDER — ROSUVASTATIN CALCIUM 10 MG PO TABS: 10.0000 mg | ORAL_TABLET | Freq: Every day | ORAL | 5 refills | Status: DC

## 2021-01-08 NOTE — Progress Notes (Addendum)
Subjective:    Patient ID: Frank Flores, male    DOB: Jun 07, 1971, 50 y.o.   MRN: [id number]  HPIFollow up on blood work results.   Patient for blood pressure check up.  The patient does have hypertension.  The patient is on medication.  Patient relates compliance with meds. Todays BP reviewed with the patient. Patient denies issues with medication. Patient relates reasonable diet. Patient tries to minimize salt. Patient aware of BP goals.  Patient here for follow-up regarding cholesterol.  The patient does have hyperlipidemia.  Patient does try to maintain a reasonable diet.  Patient does take the medication on a regular basis.  Denies missing a dose.  The patient denies any obvious side effects.  Prior blood work results reviewed with the patient.  The patient is aware of his cholesterol goals and the need to keep it under good control to lessen the risk of disease. Cholesterol profile does show that statin should be started risk of heart disease 8.6% over the next 10 years  Diabetes per lab work patient states that he could do better with diet exercise would prefer this route first. Results for orders placed or performed in visit on 12/04/20  Comprehensive Metabolic Panel (CMET)  Result Value Ref Range   Glucose 124 (H) 65 - 99 mg/dL   BUN 13 6 - 24 mg/dL   Creatinine, Ser 1.06 0.76 - 1.27 mg/dL   GFR calc non Af Amer 83 >59 mL/min/1.73   GFR calc Af Amer 95 >59 mL/min/1.73   BUN/Creatinine Ratio 12 9 - 20   Sodium 139 134 - 144 mmol/L   Potassium 4.7 3.5 - 5.2 mmol/L   Chloride 100 96 - 106 mmol/L   CO2 24 20 - 29 mmol/L   Calcium 10.3 (H) 8.7 - 10.2 mg/dL   Total Protein 7.2 6.0 - 8.5 g/dL   Albumin 4.8 4.0 - 5.0 g/dL   Globulin, Total 2.4 1.5 - 4.5 g/dL   Albumin/Globulin Ratio 2.0 1.2 - 2.2   Bilirubin Total 0.5 0.0 - 1.2 mg/dL   Alkaline Phosphatase 80 44 - 121 IU/L   AST 30 0 - 40 IU/L   ALT 39 0 - 44 IU/L  CBC with Differential  Result Value Ref Range   WBC 7.9 3.4 -  10.8 x10E3/uL   RBC 5.01 4.14 - 5.80 x10E6/uL   Hemoglobin 15.2 13.0 - 17.7 g/dL   Hematocrit 44.4 37.5 - 51.0 %   MCV 89 79 - 97 fL   MCH 30.3 26.6 - 33.0 pg   MCHC 34.2 31.5 - 35.7 g/dL   RDW 12.7 11.6 - 15.4 %   Platelets 345 150 - 450 x10E3/uL   Neutrophils 58 Not Estab. %   Lymphs 32 Not Estab. %   Monocytes 7 Not Estab. %   Eos 2 Not Estab. %   Basos 1 Not Estab. %   Neutrophils Absolute 4.5 1.4 - 7.0 x10E3/uL   Lymphocytes Absolute 2.5 0.7 - 3.1 x10E3/uL   Monocytes Absolute 0.6 0.1 - 0.9 x10E3/uL   EOS (ABSOLUTE) 0.2 0.0 - 0.4 x10E3/uL   Basophils Absolute 0.1 0.0 - 0.2 x10E3/uL   Immature Granulocytes 0 Not Estab. %   Immature Grans (Abs) 0.0 0.0 - 0.1 x10E3/uL  HgB A1c  Result Value Ref Range   Hgb A1c MFr Bld 6.7 (H) 4.8 - 5.6 %   Est. average glucose Bld gHb Est-mCnc 146 mg/dL  Lipid Profile  Result Value Ref Range   Cholesterol,  Total 236 (H) 100 - 199 mg/dL   Triglycerides 138 0 - 149 mg/dL   HDL 44 >39 mg/dL   VLDL Cholesterol Cal 25 5 - 40 mg/dL   LDL Chol Calc (NIH) 167 (H) 0 - 99 mg/dL   Chol/HDL Ratio 5.4 (H) 0.0 - 5.0 ratio      Review of Systems  Constitutional: Negative for diaphoresis and fatigue.  HENT: Negative for congestion and rhinorrhea.   Respiratory: Negative for cough and shortness of breath.   Cardiovascular: Negative for chest pain and leg swelling.  Gastrointestinal: Negative for abdominal pain and diarrhea.  Skin: Negative for color change and rash.  Neurological: Negative for dizziness and headaches.  Psychiatric/Behavioral: Negative for behavioral problems and confusion.       Objective:   Physical Exam Vitals reviewed.  Constitutional:      General: He is not in acute distress.    Appearance: He is well-nourished.  HENT:     Head: Normocephalic and atraumatic.  Eyes:     General:        Right eye: No discharge.        Left eye: No discharge.  Neck:     Trachea: No tracheal deviation.  Cardiovascular:     Rate and  Rhythm: Normal rate and regular rhythm.     Heart sounds: Normal heart sounds. No murmur heard.   Pulmonary:     Effort: Pulmonary effort is normal. No respiratory distress.     Breath sounds: Normal breath sounds.  Musculoskeletal:        General: No edema.  Lymphadenopathy:     Cervical: No cervical adenopathy.  Skin:    General: Skin is warm and dry.  Neurological:     Mental Status: He is alert.     Coordination: Coordination normal.  Psychiatric:        Mood and Affect: Mood and affect normal.        Behavior: Behavior normal.           Assessment & Plan:  1. Other hyperlipidemia Start statin he will start off 2 days a week and gradually work up he will check lab work in 3 months time he will follow-up in approximately 6 months we will do lab work in 3 months side effects were discussed if further troubles or problems he will notify us - Lipid panel  2. Essential hypertension Patient for blood pressure check up.  The patient does have hypertension.  The patient is on medication.  Patient relates compliance with meds. Todays BP reviewed with the patient. Patient denies issues with medication. Patient relates reasonable diet. Patient tries to minimize salt. Patient aware of BP goals. Blood pressure good control  3. High risk medication use Liver function when he does his blood work in 3 months - Hepatic function panel - Calcium  4. Prediabetes His A1c meets the criteria of diabetes he states he will work very hard at losing weight exercising watching diet I did offer to set him up with diabetic counselor at this point time he is holding off We will hold off on medication but if A1c does not look good in 3 months he will need to be on metformin - Hemoglobin A1c  5. Hypercalcemia Slight elevation recheck this again in 3 months if still elevated then will need further work-up  Patient has a wellness in the summer we will do all of his wellness plus colonoscopy  referral at that time Patient was encouraged to  increase activity Also encouraged to limit alcohol to no more than 2 beers in a given day he states he does not do this daily

## 2021-01-08 NOTE — Patient Instructions (Signed)
High Cholesterol  High cholesterol is a condition in which the blood has high levels of a white, waxy substance similar to fat (cholesterol). The liver makes all the cholesterol that the body needs. The human body needs small amounts of cholesterol to help build cells. A person gets extra or excess cholesterol from the food that he or she eats. The blood carries cholesterol from the liver to the rest of the body. If you have high cholesterol, deposits (plaques) may build up on the walls of your arteries. Arteries are the blood vessels that carry blood away from your heart. These plaques make the arteries narrow and stiff. Cholesterol plaques increase your risk for heart attack and stroke. Work with your health care provider to keep your cholesterol levels in a healthy range. What increases the risk? The following factors may make you more likely to develop this condition:  Eating foods that are high in animal fat (saturated fat) or cholesterol.  Being overweight.  Not getting enough exercise.  A family history of high cholesterol (familial hypercholesterolemia).  Use of tobacco products.  Having diabetes. What are the signs or symptoms? There are no symptoms of this condition. How is this diagnosed? This condition may be diagnosed based on the results of a blood test.  If you are older than 49 years of age, your health care provider may check your cholesterol levels every 4-6 years.  You may be checked more often if you have high cholesterol or other risk factors for heart disease. The blood test for cholesterol measures:  "Bad" cholesterol, or LDL cholesterol. This is the main type of cholesterol that causes heart disease. The desired level is less than 100 mg/dL.  "Good" cholesterol, or HDL cholesterol. HDL helps protect against heart disease by cleaning the arteries and carrying the LDL to the liver for processing. The desired level for HDL is 60 mg/dL or higher.  Triglycerides.  These are fats that your body can store or burn for energy. The desired level is less than 150 mg/dL.  Total cholesterol. This measures the total amount of cholesterol in your blood and includes LDL, HDL, and triglycerides. The desired level is less than 200 mg/dL. How is this treated? This condition may be treated with:  Diet changes. You may be asked to eat foods that have more fiber and less saturated fats or added sugar.  Lifestyle changes. These may include regular exercise, maintaining a healthy weight, and quitting use of tobacco products.  Medicines. These are given when diet and lifestyle changes have not worked. You may be prescribed a statin medicine to help lower your cholesterol levels. Follow these instructions at home: Eating and drinking  Eat a healthy, balanced diet. This diet includes: ? Daily servings of a variety of fresh, frozen, or canned fruits and vegetables. ? Daily servings of whole grain foods that are rich in fiber. ? Foods that are low in saturated fats and trans fats. These include poultry and fish without skin, lean cuts of meat, and low-fat dairy products. ? A variety of fish, especially oily fish that contain omega-3 fatty acids. Aim to eat fish at least 2 times a week.  Avoid foods and drinks that have added sugar.  Use healthy cooking methods, such as roasting, grilling, broiling, baking, poaching, steaming, and stir-frying. Do not fry your food except for stir-frying.   Lifestyle  Get regular exercise. Aim to exercise for a total of 150 minutes a week. Increase your activity level by doing activities  such as gardening, walking, and taking the stairs.  Do not use any products that contain nicotine or tobacco, such as cigarettes, e-cigarettes, and chewing tobacco. If you need help quitting, ask your health care provider.   General instructions  Take over-the-counter and prescription medicines only as told by your health care provider.  Keep all  follow-up visits as told by your health care provider. This is important. Where to find more information  American Heart Association: www.heart.org  National Heart, Lung, and Blood Institute: https://wilson-eaton.com/ Contact a health care provider if:  You have trouble achieving or maintaining a healthy diet or weight.  You are starting an exercise program.  You are unable to stop smoking. Get help right away if:  You have chest pain.  You have trouble breathing.  You have any symptoms of a stroke. "BE FAST" is an easy way to remember the main warning signs of a stroke: ? B - Balance. Signs are dizziness, sudden trouble walking, or loss of balance. ? E - Eyes. Signs are trouble seeing or a sudden change in vision. ? F - Face. Signs are sudden weakness or numbness of the face, or the face or eyelid drooping on one side. ? A - Arms. Signs are weakness or numbness in an arm. This happens suddenly and usually on one side of the body. ? S - Speech. Signs are sudden trouble speaking, slurred speech, or trouble understanding what people say. ? T - Time. Time to call emergency services. Write down what time symptoms started.  You have other signs of a stroke, such as: ? A sudden, severe headache with no known cause. ? Nausea or vomiting. ? Seizure. These symptoms may represent a serious problem that is an emergency. Do not wait to see if the symptoms will go away. Get medical help right away. Call your local emergency services (911 in the [place] Do not drive yourself to the hospital. Summary  Cholesterol plaques increase your risk for heart attack and stroke. Work with your health care provider to keep your cholesterol levels in a healthy range.  Eat a healthy, balanced diet, get regular exercise, and maintain a healthy weight.  Do not use any products that contain nicotine or tobacco, such as cigarettes, e-cigarettes, and chewing tobacco.  Get help right away if you have any symptoms of a  stroke. This information is not intended to replace advice given to you by your health care provider. Make sure you discuss any questions you have with your health care provider. Document Revised: 11/13/2019 Document Reviewed: 11/13/2019 Elsevier Patient Education  2021 [place]. Cholesterol Content in Foods Cholesterol is a waxy, fat-like substance that helps to carry fat in the blood. The body needs cholesterol in small amounts, but too much cholesterol can cause damage to the arteries and heart. Most people should eat less than 200 milligrams (mg) of cholesterol a day. Foods with cholesterol Cholesterol is found in animal-based foods, such as meat, seafood, and dairy. Generally, low-fat dairy and lean meats have less cholesterol than full-fat dairy and fatty meats. The milligrams of cholesterol per serving (mg per serving) of common cholesterol-containing foods are listed below. Meat and other proteins  Egg -- one large whole egg has 186 mg.  Veal shank -- 4 oz has 141 mg.  Lean ground [place] (93% lean) -- 4 oz has 118 mg.  Fat-trimmed lamb loin -- 4 oz has 106 mg.  Lean ground beef (90% lean) -- 4 oz has 100 mg.  Lobster -- 3.5 oz has 90 mg.  Pork loin chops -- 4 oz has 86 mg.  Canned salmon -- 3.5 oz has 83 mg.  Fat-trimmed beef top loin -- 4 oz has 78 mg.  Frankfurter -- 1 frank (3.5 oz) has 77 mg.  Crab -- 3.5 oz has 71 mg.  Roasted chicken without skin, white meat -- 4 oz has 66 mg.  Light bologna -- 2 oz has 45 mg.  Deli-cut [place] -- 2 oz has 31 mg.  Canned tuna -- 3.5 oz has 31 mg.  Berniece Salines -- 1 oz has 29 mg.  Oysters and mussels (raw) -- 3.5 oz has 25 mg.  Mackerel -- 1 oz has 22 mg.  Trout -- 1 oz has 20 mg.  Pork sausage -- 1 link (1 oz) has 17 mg.  Salmon -- 1 oz has 16 mg.  Tilapia -- 1 oz has 14 mg. Dairy  Soft-serve ice cream --  cup (4 oz) has 103 mg.  Whole-milk yogurt -- 1 cup (8 oz) has 29 mg.  Cheddar cheese -- 1 oz has 28  mg.  American cheese -- 1 oz has 28 mg.  Whole milk -- 1 cup (8 oz) has 23 mg.  2% milk -- 1 cup (8 oz) has 18 mg.  Cream cheese -- 1 tablespoon (Tbsp) has 15 mg.  Cottage cheese --  cup (4 oz) has 14 mg.  Low-fat (1%) milk -- 1 cup (8 oz) has 10 mg.  Sour cream -- 1 Tbsp has 8.5 mg.  Low-fat yogurt -- 1 cup (8 oz) has 8 mg.  Nonfat Greek yogurt -- 1 cup (8 oz) has 7 mg.  Half-and-half cream -- 1 Tbsp has 5 mg. Fats and oils  Cod liver oil -- 1 tablespoon (Tbsp) has 82 mg.  Butter -- 1 Tbsp has 15 mg.  Lard -- 1 Tbsp has 14 mg.  Bacon grease -- 1 Tbsp has 14 mg.  Mayonnaise -- 1 Tbsp has 5-10 mg.  Margarine -- 1 Tbsp has 3-10 mg. Exact amounts of cholesterol in these foods may vary depending on specific ingredients and brands.   Foods without cholesterol Most plant-based foods do not have cholesterol unless you combine them with a food that has cholesterol. Foods without cholesterol include:  Grains and cereals.  Vegetables.  Fruits.  Vegetable oils, such as olive, canola, and sunflower oil.  Legumes, such as peas, beans, and lentils.  Nuts and seeds.  Egg whites.   Summary  The body needs cholesterol in small amounts, but too much cholesterol can cause damage to the arteries and heart.  Most people should eat less than 200 milligrams (mg) of cholesterol a day. This information is not intended to replace advice given to you by your health care provider. Make sure you discuss any questions you have with your health care provider. Document Revised: 05/06/2020 Document Reviewed: 05/06/2020 Elsevier Patient Education  2021 [place]. Diabetes Mellitus and Nutrition, Adult When you have diabetes, or diabetes mellitus, it is very important to have healthy eating habits because your blood sugar (glucose) levels are greatly affected by what you eat and drink. Eating healthy foods in the right amounts, at about the same times every day, can help you:  Control  your blood glucose.  Lower your risk of heart disease.  Improve your blood pressure.  Reach or maintain a healthy weight. What can affect my meal plan? Every person with diabetes is different, and each person has different needs  for a meal plan. Your health care provider may recommend that you work with a dietitian to make a meal plan that is best for you. Your meal plan may vary depending on factors such as:  The calories you need.  The medicines you take.  Your weight.  Your blood glucose, blood pressure, and cholesterol levels.  Your activity level.  Other health conditions you have, such as heart or kidney disease. How do carbohydrates affect me? Carbohydrates, also called carbs, affect your blood glucose level more than any other type of food. Eating carbs naturally raises the amount of glucose in your blood. Carb counting is a method for keeping track of how many carbs you eat. Counting carbs is important to keep your blood glucose at a healthy level, especially if you use insulin or take certain oral diabetes medicines. It is important to know how many carbs you can safely have in each meal. This is different for every person. Your dietitian can help you calculate how many carbs you should have at each meal and for each snack. How does alcohol affect me? Alcohol can cause a sudden decrease in blood glucose (hypoglycemia), especially if you use insulin or take certain oral diabetes medicines. Hypoglycemia can be a life-threatening condition. Symptoms of hypoglycemia, such as sleepiness, dizziness, and confusion, are similar to symptoms of having too much alcohol.  Do not drink alcohol if: ? Your health care provider tells you not to drink. ? You are pregnant, may be pregnant, or are planning to become pregnant.  If you drink alcohol: ? Do not drink on an empty stomach. ? Limit how much you use to:  0-1 drink a day for women.  0-2 drinks a day for men. ? Be aware of how  much alcohol is in your drink. In the [place] one drink equals one 12 oz bottle of beer (355 mL), one 5 oz glass of wine (148 mL), or one 1 oz glass of hard liquor (44 mL). ? Keep yourself hydrated with water, diet soda, or unsweetened iced tea.  Keep in mind that regular soda, juice, and other mixers may contain a lot of sugar and must be counted as carbs. What are tips for following this plan? Reading food labels  Start by checking the serving size on the "Nutrition Facts" label of packaged foods and drinks. The amount of calories, carbs, fats, and other nutrients listed on the label is based on one serving of the item. Many items contain more than one serving per package.  Check the total grams (g) of carbs in one serving. You can calculate the number of servings of carbs in one serving by dividing the total carbs by 15. For example, if a food has 30 g of total carbs per serving, it would be equal to 2 servings of carbs.  Check the number of grams (g) of saturated fats and trans fats in one serving. Choose foods that have a low amount or none of these fats.  Check the number of milligrams (mg) of salt (sodium) in one serving. Most people should limit total sodium intake to less than 2,300 mg per day.  Always check the nutrition information of foods labeled as "low-fat" or "nonfat." These foods may be higher in added sugar or refined carbs and should be avoided.  Talk to your dietitian to identify your daily goals for nutrients listed on the label. Shopping  Avoid buying canned, pre-made, or processed foods. These foods tend to be high in  fat, sodium, and added sugar.  Shop around the outside edge of the grocery store. This is where you will most often find fresh fruits and vegetables, bulk grains, fresh meats, and fresh dairy. Cooking  Use low-heat cooking methods, such as baking, instead of high-heat cooking methods like deep frying.  Cook using healthy oils, such as olive, canola, or  sunflower oil.  Avoid cooking with butter, cream, or high-fat meats. Meal planning  Eat meals and snacks regularly, preferably at the same times every day. Avoid going long periods of time without eating.  Eat foods that are high in fiber, such as fresh fruits, vegetables, beans, and whole grains. Talk with your dietitian about how many servings of carbs you can eat at each meal.  Eat 4-6 oz (112-168 g) of lean protein each day, such as lean meat, chicken, fish, eggs, or tofu. One ounce (oz) of lean protein is equal to: ? 1 oz (28 g) of meat, chicken, or fish. ? 1 egg. ?  cup (62 g) of tofu.  Eat some foods each day that contain healthy fats, such as avocado, nuts, seeds, and fish.   What foods should I eat? Fruits Berries. Apples. Oranges. Peaches. Apricots. Plums. Grapes. Mango. Papaya. Pomegranate. Kiwi. Cherries. Vegetables Lettuce. Spinach. Leafy greens, including kale, chard, collard greens, and mustard greens. Beets. Cauliflower. Cabbage. Broccoli. Carrots. Green beans. Tomatoes. Peppers. Onions. Cucumbers. Brussels sprouts. Grains Whole grains, such as whole-wheat or whole-grain bread, crackers, tortillas, cereal, and pasta. Unsweetened oatmeal. Quinoa. Brown or wild rice. Meats and other proteins Seafood. Poultry without skin. Lean cuts of poultry and beef. Tofu. Nuts. Seeds. Dairy Low-fat or fat-free dairy products such as milk, yogurt, and cheese. The items listed above may not be a complete list of foods and beverages you can eat. Contact a dietitian for more information. What foods should I avoid? Fruits Fruits canned with syrup. Vegetables Canned vegetables. Frozen vegetables with butter or cream sauce. Grains Refined white flour and flour products such as bread, pasta, snack foods, and cereals. Avoid all processed foods. Meats and other proteins Fatty cuts of meat. Poultry with skin. Breaded or fried meats. Processed meat. Avoid saturated fats. Dairy Full-fat  yogurt, cheese, or milk. Beverages Sweetened drinks, such as soda or iced tea. The items listed above may not be a complete list of foods and beverages you should avoid. Contact a dietitian for more information. Questions to ask a health care provider  Do I need to meet with a diabetes educator?  Do I need to meet with a dietitian?  What number can I call if I have questions?  When are the best times to check my blood glucose? Where to find more information:  American Diabetes Association: diabetes.org  Academy of Nutrition and Dietetics: www.eatright.[employer] of Diabetes and Digestive and Kidney Diseases: [employer].dk  Association of Diabetes Care and Education Specialists: www.diabeteseducator.org Summary  It is important to have healthy eating habits because your blood sugar (glucose) levels are greatly affected by what you eat and drink.  A healthy meal plan will help you control your blood glucose and maintain a healthy lifestyle.  Your health care provider may recommend that you work with a dietitian to make a meal plan that is best for you.  Keep in mind that carbohydrates (carbs) and alcohol have immediate effects on your blood glucose levels. It is important to count carbs and to use alcohol carefully. This information is not intended to replace advice given to you  by your health care provider. Make sure you discuss any questions you have with your health care provider. Document Revised: 11/21/2019 Document Reviewed: 11/21/2019 Elsevier Patient Education  2021 [employer].

## 2021-04-28 ENCOUNTER — Other Ambulatory Visit: Payer: Self-pay | Admitting: Family Medicine

## 2021-04-28 ENCOUNTER — Telehealth: Payer: Self-pay

## 2021-04-28 NOTE — Telephone Encounter (Signed)
Pt needs refill on  albuterol (VENTOLIN HFA) 108 (90 Base) MCG/ACT [facility] does not like the generic [place], [place]    Pt call back [phone]

## 2021-04-28 NOTE — Telephone Encounter (Signed)
2 refills   

## 2021-04-28 NOTE — Telephone Encounter (Signed)
Last seen 01/08/21. Please advise. Thank you

## 2021-04-29 ENCOUNTER — Other Ambulatory Visit: Payer: Self-pay

## 2021-04-29 DIAGNOSIS — J4540 Moderate persistent asthma, uncomplicated: Secondary | ICD-10-CM

## 2021-04-29 MED ORDER — ALBUTEROL SULFATE HFA 108 (90 BASE) MCG/ACT IN AERS: INHALATION_SPRAY | RESPIRATORY_TRACT | 2 refills | Status: DC

## 2021-06-04 ENCOUNTER — Ambulatory Visit: Payer: 59 | Admitting: Family Medicine

## 2021-06-10 ENCOUNTER — Ambulatory Visit: Payer: 59 | Admitting: Family Medicine

## 2021-06-11 ENCOUNTER — Other Ambulatory Visit: Payer: Self-pay

## 2021-06-11 ENCOUNTER — Ambulatory Visit: Payer: 59 | Admitting: Family Medicine

## 2021-06-11 ENCOUNTER — Encounter: Payer: Self-pay | Admitting: Family Medicine

## 2021-06-11 VITALS — BP 104/73 | HR 100 | Temp 97.5°F | Wt 245.5 lb

## 2021-06-11 DIAGNOSIS — E7849 Other hyperlipidemia: Secondary | ICD-10-CM | POA: Diagnosis not present

## 2021-06-11 DIAGNOSIS — R7303 Prediabetes: Secondary | ICD-10-CM

## 2021-06-11 DIAGNOSIS — I10 Essential (primary) hypertension: Secondary | ICD-10-CM

## 2021-06-11 DIAGNOSIS — F320 Major depressive disorder, single episode, mild: Secondary | ICD-10-CM

## 2021-06-11 MED ORDER — LISINOPRIL 10 MG PO TABS: ORAL_TABLET | ORAL | 5 refills | Status: DC

## 2021-06-11 MED ORDER — ROSUVASTATIN CALCIUM 10 MG PO TABS: 10.0000 mg | ORAL_TABLET | Freq: Every day | ORAL | 5 refills | Status: DC

## 2021-06-11 MED ORDER — CITALOPRAM HYDROBROMIDE 20 MG PO TABS
ORAL_TABLET | ORAL | 5 refills | Status: DC
Start: 2021-06-11 — End: 2022-01-12

## 2021-06-11 NOTE — Progress Notes (Signed)
   Subjective:    Patient ID: Frank Flores, male    DOB: 10-10-1971, 50 y.o.   MRN: [id number]  HPI Pt here for follow up. Pt states no issues with blood pressure. Pt taking all meds as directed.   Patient relates that having to work in the hot weather drinking lots of liquids trying to stay cool he also stays physically active by doing some golf as well as other exercise.  He denies any major setbacks currently he does take his cholesterol medicine and blood pressure medicine He does have stress related issues at work and he states he does better with the Celexa and would like to continue taking it.  Denies being depressed  Review of Systems     Objective:   Physical Exam General-in no acute distress Eyes-no discharge Lungs-respiratory rate normal, CTA CV-no murmurs,RRR Extremities skin warm dry no edema Neuro grossly normal Behavior normal, alert   Patient was advised to get colonoscopy he will check with his insurance company to see if it is covered because of his age if it is covered he will let us know we will help set it up     Assessment & Plan:  Blood pressure decent control patient chooses to stay on the current medication versus reducing it. Moods overall are doing well he prefers to stay on Celexa helps him with stress Hyperlipidemia continue cholesterol medicine follow-up if any ongoing troubles. Labs ordered await results Follow-up in 6 months

## 2021-07-31 ENCOUNTER — Other Ambulatory Visit: Payer: Self-pay | Admitting: Family Medicine

## 2021-07-31 DIAGNOSIS — J4540 Moderate persistent asthma, uncomplicated: Secondary | ICD-10-CM

## 2021-12-17 ENCOUNTER — Ambulatory Visit: Payer: 59 | Admitting: Family Medicine

## 2022-01-12 ENCOUNTER — Telehealth: Payer: Self-pay | Admitting: Family Medicine

## 2022-01-12 DIAGNOSIS — F320 Major depressive disorder, single episode, mild: Secondary | ICD-10-CM

## 2022-01-12 DIAGNOSIS — I10 Essential (primary) hypertension: Secondary | ICD-10-CM

## 2022-01-12 MED ORDER — LISINOPRIL 10 MG PO TABS: ORAL_TABLET | ORAL | 0 refills | Status: DC

## 2022-01-12 MED ORDER — CITALOPRAM HYDROBROMIDE 20 MG PO TABS: ORAL_TABLET | ORAL | 0 refills | Status: DC

## 2022-01-12 NOTE — Telephone Encounter (Signed)
Prescriptions sent electronically to pharmacy. Patient notified.

## 2022-01-12 NOTE — Telephone Encounter (Signed)
Patient had to move his appt from this Wednesday due to conflict with work schedule. He has made another appt for 02/25/22 he needs his lisinopril and celexa called into [facility] he believes he will be 2 weeks shy from having enough medication until then.  CB# [phone]

## 2022-01-14 ENCOUNTER — Ambulatory Visit: Payer: 59 | Admitting: Family Medicine

## 2022-02-25 ENCOUNTER — Ambulatory Visit: Payer: 59 | Admitting: Family Medicine

## 2022-02-25 ENCOUNTER — Other Ambulatory Visit: Payer: Self-pay

## 2022-02-25 VITALS — BP 112/70 | HR 78 | Ht 71.0 in | Wt 253.6 lb

## 2022-02-25 DIAGNOSIS — J4540 Moderate persistent asthma, uncomplicated: Secondary | ICD-10-CM | POA: Diagnosis not present

## 2022-02-25 DIAGNOSIS — I10 Essential (primary) hypertension: Secondary | ICD-10-CM | POA: Diagnosis not present

## 2022-02-25 DIAGNOSIS — R5383 Other fatigue: Secondary | ICD-10-CM

## 2022-02-25 DIAGNOSIS — Z125 Encounter for screening for malignant neoplasm of prostate: Secondary | ICD-10-CM

## 2022-02-25 DIAGNOSIS — E7849 Other hyperlipidemia: Secondary | ICD-10-CM

## 2022-02-25 DIAGNOSIS — F325 Major depressive disorder, single episode, in full remission: Secondary | ICD-10-CM

## 2022-02-25 DIAGNOSIS — R7303 Prediabetes: Secondary | ICD-10-CM

## 2022-02-25 MED ORDER — LISINOPRIL 10 MG PO TABS: ORAL_TABLET | ORAL | 6 refills | Status: DC

## 2022-02-25 MED ORDER — ALBUTEROL SULFATE HFA 108 (90 BASE) MCG/ACT IN AERS: INHALATION_SPRAY | RESPIRATORY_TRACT | 2 refills | Status: DC

## 2022-02-25 MED ORDER — ROSUVASTATIN CALCIUM 10 MG PO TABS: 10.0000 mg | ORAL_TABLET | Freq: Every day | ORAL | 5 refills | Status: DC

## 2022-02-25 MED ORDER — CITALOPRAM HYDROBROMIDE 20 MG PO TABS: ORAL_TABLET | ORAL | 6 refills | Status: DC

## 2022-02-25 NOTE — Progress Notes (Signed)
? ?  Subjective:  ? ? Patient ID: Frank Flores, male    DOB: 01/23/1971, 51 y.o.   MRN: [id number] ? ?Hypertension ?This is a chronic problem. The current episode started more than 1 year ago. Risk factors for coronary artery disease include dyslipidemia. Treatments tried: lisinopril. There are no compliance problems.   ? ?Not as much energy as normal for last few months ? ?Review of Systems ? ?   ?Objective:  ? Physical Exam ? ?General-in no acute distress ?Eyes-no discharge ?Lungs-respiratory rate normal, CTA ?CV-no murmurs,RRR ?Extremities skin warm dry no edema ?Neuro grossly normal ?Behavior normal, alert ? ? ? ?   ?Assessment & Plan:  ? ?1. Essential hypertension ?Blood pressure decent control continue medication ?- lisinopril (ZESTRIL) 10 MG tablet; TAKE ONE TABLET (10MG TOTAL) BY MOUTH DAILY  Dispense: 30 tablet; Refill: 6 ?- Basic metabolic panel ?- Hepatic function panel ? ?2. Other hyperlipidemia ?Continue cholesterol medicine watch diet stay active ?- Lipid panel ?- Hepatic function panel ? ?3. Fatigue, unspecified type ?Patient feels it is related to just the bad weather during wintertime he feels things will get better in the spring.  Patient defers on any type of sleep study currently. ?- CBC with Differential/Platelet ?- TSH ? ?4. Depression, major, single episode, mild ([place]) ?Patient states his moods overall are doing well continue current medication ?- citalopram (CELEXA) 20 MG tablet; TAKE ONE (1) TABLET BY MOUTH EVERY DAY  Dispense: 30 tablet; Refill: 6 ? ?5. Moderate persistent asthma without complication ?Uses albuterol as needed states has been doing well lately ?- albuterol (VENTOLIN HFA) 108 (90 Base) MCG/ACT inhaler; INHALE TWO PUFFS INTO THE LUNGS EVERY FOUR HOURS AS NEEDED  Dispense: 18 g; Refill: 2 ? ?6. Screening PSA (prostate specific antigen) ?Screening ?- PSA ? ?7. Prediabetes ?Check A1c watch starches in diet ?- Hemoglobin O1L ?- Basic metabolic panel ? ?

## 2022-03-12 LAB — CBC WITH DIFFERENTIAL/PLATELET
Basophils Absolute: 0.1 10*3/uL (ref 0.0–0.2)
Basos: 1 %
EOS (ABSOLUTE): 0.2 10*3/uL (ref 0.0–0.4)
Eos: 2 %
Hematocrit: 44.8 % (ref 37.5–51.0)
Hemoglobin: 15.3 g/dL (ref 13.0–17.7)
Immature Grans (Abs): 0 10*3/uL (ref 0.0–0.1)
Immature Granulocytes: 0 %
Lymphocytes Absolute: 2.7 10*3/uL (ref 0.7–3.1)
Lymphs: 32 %
MCH: 30 pg (ref 26.6–33.0)
MCHC: 34.2 g/dL (ref 31.5–35.7)
MCV: 88 fL (ref 79–97)
Monocytes Absolute: 0.7 10*3/uL (ref 0.1–0.9)
Monocytes: 8 %
Neutrophils Absolute: 4.7 10*3/uL (ref 1.4–7.0)
Neutrophils: 57 %
Platelets: 349 10*3/uL (ref 150–450)
RBC: 5.1 x10E6/uL (ref 4.14–5.80)
RDW: 13.1 % (ref 11.6–15.4)
WBC: 8.4 10*3/uL (ref 3.4–10.8)

## 2022-03-12 LAB — BASIC METABOLIC PANEL
BUN/Creatinine Ratio: 21 — ABNORMAL HIGH (ref 9–20)
BUN: 24 mg/dL (ref 6–24)
CO2: 21 mmol/L (ref 20–29)
Calcium: 10 mg/dL (ref 8.7–10.2)
Chloride: 99 mmol/L (ref 96–106)
Creatinine, Ser: 1.14 mg/dL (ref 0.76–1.27)
Glucose: 140 mg/dL — ABNORMAL HIGH (ref 70–99)
Potassium: 4.6 mmol/L (ref 3.5–5.2)
Sodium: 135 mmol/L (ref 134–144)
eGFR: 79 mL/min/{1.73_m2} (ref >59)

## 2022-03-12 LAB — HEMOGLOBIN A1C
Est. average glucose Bld gHb Est-mCnc: 154 mg/dL
Hgb A1c MFr Bld: 7 % — ABNORMAL HIGH (ref 4.8–5.6)

## 2022-03-12 LAB — HEPATIC FUNCTION PANEL
ALT: 44 IU/L (ref 0–44)
AST: 43 IU/L — ABNORMAL HIGH (ref 0–40)
Albumin: 4.8 g/dL (ref 4.0–5.0)
Alkaline Phosphatase: 75 IU/L (ref 44–121)
Bilirubin Total: 0.7 mg/dL (ref 0.0–1.2)
Bilirubin, Direct: 0.17 mg/dL (ref 0.00–0.40)
Total Protein: 7.1 g/dL (ref 6.0–8.5)

## 2022-03-12 LAB — LIPID PANEL
Chol/HDL Ratio: 3.8 ratio (ref 0.0–5.0)
Cholesterol, Total: 170 mg/dL (ref 100–199)
HDL: 45 mg/dL (ref >39)
LDL Chol Calc (NIH): 100 mg/dL — ABNORMAL HIGH (ref 0–99)
Triglycerides: 139 mg/dL (ref 0–149)
VLDL Cholesterol Cal: 25 mg/dL (ref 5–40)

## 2022-03-12 LAB — PSA: Prostate Specific Ag, Serum: 0.5 ng/mL (ref 0.0–4.0)

## 2022-03-12 LAB — TSH: TSH: 1.24 u[IU]/mL (ref 0.450–4.500)

## 2022-03-18 ENCOUNTER — Ambulatory Visit (INDEPENDENT_AMBULATORY_CARE_PROVIDER_SITE_OTHER): Payer: 59 | Admitting: Family Medicine

## 2022-03-18 ENCOUNTER — Other Ambulatory Visit: Payer: Self-pay

## 2022-03-18 ENCOUNTER — Encounter: Payer: Self-pay | Admitting: Family Medicine

## 2022-03-18 VITALS — BP 125/87 | HR 89 | Temp 98.1°F | Ht 71.0 in | Wt 258.0 lb

## 2022-03-18 DIAGNOSIS — Z0000 Encounter for general adult medical examination without abnormal findings: Secondary | ICD-10-CM | POA: Diagnosis not present

## 2022-03-18 DIAGNOSIS — Z23 Encounter for immunization: Secondary | ICD-10-CM | POA: Diagnosis not present

## 2022-03-18 DIAGNOSIS — E7849 Other hyperlipidemia: Secondary | ICD-10-CM | POA: Diagnosis not present

## 2022-03-18 DIAGNOSIS — R7303 Prediabetes: Secondary | ICD-10-CM

## 2022-03-18 DIAGNOSIS — Z1211 Encounter for screening for malignant neoplasm of colon: Secondary | ICD-10-CM

## 2022-03-18 DIAGNOSIS — E1169 Type 2 diabetes mellitus with other specified complication: Secondary | ICD-10-CM | POA: Diagnosis not present

## 2022-03-18 DIAGNOSIS — E785 Hyperlipidemia, unspecified: Secondary | ICD-10-CM

## 2022-03-18 DIAGNOSIS — E119 Type 2 diabetes mellitus without complications: Secondary | ICD-10-CM

## 2022-03-18 DIAGNOSIS — Z79899 Other long term (current) drug therapy: Secondary | ICD-10-CM

## 2022-03-18 MED ORDER — METFORMIN HCL 500 MG PO TABS: 500.0000 mg | ORAL_TABLET | Freq: Two times a day (BID) | ORAL | 5 refills | Status: DC

## 2022-03-18 NOTE — Patient Instructions (Signed)

## 2022-03-18 NOTE — Progress Notes (Addendum)
? ?Subjective:  ? ? Patient ID: Frank Flores, male    DOB: 1970-12-30, 51 y.o.   MRN: [id number] ? ?HPI ?The patient comes in today for a wellness visit. ?Very nice patient ?Recently went on weight watchers ?Working hard with physical activity and healthy eating to try to lose weight ?We went over his lab work today including diabetes ?Cholesterol above goal ?He does state that he occasionally has some beer but he is going to try to cut that back only has on the weekends when he plays golf ? ? ?A review of their health history was completed. ? A review of medications was also completed. ? ?Any needed refills; no ? ?Eating habits: no ? ?Falls/  MVA accidents in past few months: no ? ?Regular exercise: work outside ? ?Specialist pt sees on regular basis: no ? ?Preventative health issues were discussed.  ? ?Additional concerns: none, discuss lab results, signature on wellness screening form , ?Has D/C'd citalopram - feeling better  ?Results for orders placed or performed in visit on 02/25/22  ?CBC with Differential/Platelet  ?Result Value Ref Range  ? WBC 8.4 3.4 - 10.8 x10E3/uL  ? RBC 5.10 4.14 - 5.80 x10E6/uL  ? Hemoglobin 15.3 13.0 - 17.7 g/dL  ? Hematocrit 44.8 37.5 - 51.0 %  ? MCV 88 79 - 97 fL  ? MCH 30.0 26.6 - 33.0 pg  ? MCHC 34.2 31.5 - 35.7 g/dL  ? RDW 13.1 11.6 - 15.4 %  ? Platelets 349 150 - 450 x10E3/uL  ? Neutrophils 57 Not Estab. %  ? Lymphs 32 Not Estab. %  ? Monocytes 8 Not Estab. %  ? Eos 2 Not Estab. %  ? Basos 1 Not Estab. %  ? Neutrophils Absolute 4.7 1.4 - 7.0 x10E3/uL  ? Lymphocytes Absolute 2.7 0.7 - 3.1 x10E3/uL  ? Monocytes Absolute 0.7 0.1 - 0.9 x10E3/uL  ? EOS (ABSOLUTE) 0.2 0.0 - 0.4 x10E3/uL  ? Basophils Absolute 0.1 0.0 - 0.2 x10E3/uL  ? Immature Granulocytes 0 Not Estab. %  ? Immature Grans (Abs) 0.0 0.0 - 0.1 x10E3/uL  ?TSH  ?Result Value Ref Range  ? TSH 1.240 0.450 - 4.500 uIU/mL  ?Hemoglobin A1c  ?Result Value Ref Range  ? Hgb A1c MFr Bld 7.0 (H) 4.8 - 5.6 %  ? Est. average glucose  Bld gHb Est-mCnc 154 mg/dL  ?Basic metabolic panel  ?Result Value Ref Range  ? Glucose 140 (H) 70 - 99 mg/dL  ? BUN 24 6 - 24 mg/dL  ? Creatinine, Ser 1.14 0.76 - 1.27 mg/dL  ? eGFR 79 >59 mL/min/1.73  ? BUN/Creatinine Ratio 21 (H) 9 - 20  ? Sodium 135 134 - 144 mmol/L  ? Potassium 4.6 3.5 - 5.2 mmol/L  ? Chloride 99 96 - 106 mmol/L  ? CO2 21 20 - 29 mmol/L  ? Calcium 10.0 8.7 - 10.2 mg/dL  ?Lipid panel  ?Result Value Ref Range  ? Cholesterol, Total 170 100 - 199 mg/dL  ? Triglycerides 139 0 - 149 mg/dL  ? HDL 45 >39 mg/dL  ? VLDL Cholesterol Cal 25 5 - 40 mg/dL  ? LDL Chol Calc (NIH) 100 (H) 0 - 99 mg/dL  ? Chol/HDL Ratio 3.8 0.0 - 5.0 ratio  ?Hepatic function panel  ?Result Value Ref Range  ? Total Protein 7.1 6.0 - 8.5 g/dL  ? Albumin 4.8 4.0 - 5.0 g/dL  ? Bilirubin Total 0.7 0.0 - 1.2 mg/dL  ? Bilirubin, Direct 0.17 0.00 -  0.40 mg/dL  ? Alkaline Phosphatase 75 44 - 121 IU/L  ? AST 43 (H) 0 - 40 IU/L  ? ALT 44 0 - 44 IU/L  ?PSA  ?Result Value Ref Range  ? Prostate Specific Ag, Serum 0.5 0.0 - 4.0 ng/mL  ? ? ?Review of Systems ? ?   ?Objective:  ? Physical Exam ?General-in no acute distress ?Eyes-no discharge ?Lungs-respiratory rate normal, CTA ?CV-no murmurs,RRR ?Extremities skin warm dry no edema ?Neuro grossly normal ?Behavior normal, alert ?Prostate exam not indicated GU is normal extremities no edema diabetic foot exam normal ? ? ? ?   ?Assessment & Plan:  ? ?1. Well adult exam ?Adult wellness-complete.wellness physical was conducted today. Importance of diet and exercise were discussed in detail.  ?In addition to this a discussion regarding safety was also covered. We also reviewed over immunizations and gave recommendations regarding current immunization needed for age.  ?In addition to this additional areas were also touched on including: ?Preventative health exams needed: ? ?Colonoscopy patient wants to do Cologuard ? ?Patient was advised yearly wellness exam ? ?- Pneumococcal conjugate vaccine 20-valent  (Prevnar 20) ?- Lipid panel ?- Hemoglobin D0N ?- Basic metabolic panel ?- Urine Microalbumin w/creat. ratio ? ?2. Other hyperlipidemia ?LDL not at goal he states she is working really hard on diet would like to continue current medication recheck labs in 3 months if not at goal we will adjust medicine ?- Lipid panel ?- Hemoglobin X8Z ?- Basic metabolic panel ?- Urine Microalbumin w/creat. ratio ? ?3. Hyperlipidemia associated with type 2 diabetes mellitus ([place]) ?See above ? ?4. Immunization due ?Pneumococcal today ?- Pneumococcal conjugate vaccine 20-valent (Prevnar 20) ? ?5. Prediabetes ?Type 2 diabetes controlled patient will start metformin daily if progressive troubles or worse follow-up recheck labs in 3 to 4 months ?- Lipid panel ?- Hemoglobin F5O ?- Basic metabolic panel ?- Urine Microalbumin w/creat. ratio ? ?6. High risk medication use ?Check labs ?- Lipid panel ?- Hemoglobin I5P ?- Basic metabolic panel ?- Urine Microalbumin w/creat. ratio ? ?7. Screening for colon cancer ?Cologuard ?- Cologuard ?We did discuss risk and benefits of metformin and start off low-dose if any problems notify us do not take medicine if having vomiting and diarrhea ? ?He states his moods are doing well he denies being depressed stop taking Celexa states he feels better overall ?

## 2022-03-20 ENCOUNTER — Other Ambulatory Visit: Payer: Self-pay | Admitting: *Deleted

## 2022-03-20 MED ORDER — METFORMIN HCL 500 MG PO TABS: 500.0000 mg | ORAL_TABLET | Freq: Two times a day (BID) | ORAL | 5 refills | Status: DC

## 2022-03-29 ENCOUNTER — Ambulatory Visit
Admission: EM | Admit: 2022-03-29 | Discharge: 2022-03-29 | Disposition: A | Discharge status: Home / Self Care | Payer: 59 | Attending: Urgent Care | Admitting: Urgent Care

## 2022-03-29 DIAGNOSIS — J069 Acute upper respiratory infection, unspecified: Secondary | ICD-10-CM

## 2022-03-29 DIAGNOSIS — J309 Allergic rhinitis, unspecified: Secondary | ICD-10-CM | POA: Diagnosis not present

## 2022-03-29 DIAGNOSIS — H1013 Acute atopic conjunctivitis, bilateral: Secondary | ICD-10-CM

## 2022-03-29 DIAGNOSIS — R070 Pain in throat: Secondary | ICD-10-CM

## 2022-03-29 DIAGNOSIS — H5789 Other specified disorders of eye and adnexa: Secondary | ICD-10-CM | POA: Diagnosis not present

## 2022-03-29 DIAGNOSIS — R0981 Nasal congestion: Secondary | ICD-10-CM

## 2022-03-29 LAB — POCT RAPID STREP A (OFFICE): Rapid Strep A Screen: NEGATIVE

## 2022-03-29 MED ORDER — PREDNISONE 20 MG PO TABS: ORAL_TABLET | ORAL | 0 refills | Status: DC

## 2022-03-29 MED ORDER — PROMETHAZINE-DM 6.25-15 MG/5ML PO SYRP: 5.0000 mL | ORAL_SOLUTION | Freq: Three times a day (TID) | ORAL | 0 refills | Status: DC | PRN

## 2022-03-29 MED ORDER — TOBRAMYCIN 0.3 % OP SOLN: 1.0000 [drp] | OPHTHALMIC | 0 refills | Status: DC

## 2022-03-29 MED ORDER — OLOPATADINE HCL 0.1 % OP SOLN: 1.0000 [drp] | Freq: Two times a day (BID) | OPHTHALMIC | 0 refills | Status: DC

## 2022-03-29 MED ORDER — LEVOCETIRIZINE DIHYDROCHLORIDE 5 MG PO TABS
5.0000 mg | ORAL_TABLET | Freq: Every evening | ORAL | 0 refills | Status: DC
Start: 2022-03-29 — End: 2022-09-28

## 2022-03-29 NOTE — ED Triage Notes (Signed)
Pt reports redness in eyes x 1 day; discharge in yes since this morning; sore throat, chills, body aches x 2 days. Theraflu gives some relief.  ?

## 2022-03-29 NOTE — ED Provider Notes (Signed)
?[facility] ? ? ?MRN: [id number] DOB: 1971-11-30 ? ?Subjective:  ? ?Frank Flores is a 51 y.o. male presenting for 1 day history of acute onset redness and discharge from both eyes.  He is also had a runny stuffy nose, persistent cough, throat pain and drainage.  Patient has a history of difficult allergies.  He also has a history of asthma but has not felt that this is bothering him recently.  He has well-controlled diabetes treated without insulin.  He has been using Delsym with some relief of his cough.  He did use Visine for his eyes but is no longer helping him.  No contact lens use.  No eye trauma.  No eye pain, vision change. ? ?No current facility-administered medications for this encounter. ? ?Current Outpatient Medications:  ?  albuterol (VENTOLIN HFA) 108 (90 Base) MCG/ACT inhaler, INHALE TWO PUFFS INTO THE LUNGS EVERY FOUR HOURS AS NEEDED, Disp: 18 g, Rfl: 2 ?  lisinopril (ZESTRIL) 10 MG tablet, TAKE ONE TABLET (10MG TOTAL) BY MOUTH DAILY, Disp: 30 tablet, Rfl: 6 ?  metFORMIN (GLUCOPHAGE) 500 MG tablet, Take 1 tablet (500 mg total) by mouth 2 (two) times daily with a meal., Disp: 60 tablet, Rfl: 5 ?  rosuvastatin (CRESTOR) 10 MG tablet, Take 1 tablet (10 mg total) by mouth daily., Disp: 30 tablet, Rfl: 5  ? ?Allergies  ?Allergen Reactions  ? Penicillins Anaphylaxis  ? ? ?Past Medical History:  ?Diagnosis Date  ? Allergy   ? seasonal  ? Asthma   ? minor  ? Hypertension   ? 2015  ?  ? ?History reviewed. No pertinent surgical history. ? ?Family History  ?Problem Relation Age of Onset  ? Heart disease Father   ? ? ?Social History  ? ?Tobacco Use  ? Smoking status: Never  ? Smokeless tobacco: Never  ?Substance Use Topics  ? Alcohol use: Yes  ? Drug use: Never  ? ? ?ROS ? ? ?Objective:  ? ?Vitals: ?BP 126/84 (BP Location: Right Arm)   Pulse (!) 111   Temp 98.4 ?F (36.9 ?C) (Oral)   Resp 18   SpO2 98%  ? ?Physical Exam ?Constitutional:   ?   General: He is not in acute distress. ?    Appearance: Normal appearance. He is well-developed and normal weight. He is not ill-appearing, toxic-appearing or diaphoretic.  ?HENT:  ?   Head: Normocephalic and atraumatic.  ?   Right Ear: Tympanic membrane, ear canal and external ear normal. There is no impacted cerumen.  ?   Left Ear: Tympanic membrane, ear canal and external ear normal. There is no impacted cerumen.  ?   Nose: Congestion and rhinorrhea present.  ?   Mouth/Throat:  ?   Mouth: Mucous membranes are moist.  ?   Pharynx: Oropharynx is clear. Posterior oropharyngeal erythema present. No oropharyngeal exudate.  ?   Comments: Thick streaks of postnasal drainage overlying pharynx with associated erythema. ?Eyes:  ?   General: Lids are everted, no foreign bodies appreciated. No scleral icterus.    ?   Right eye: No foreign body, discharge or hordeolum.     ?   Left eye: No foreign body, discharge or hordeolum.  ?   Extraocular Movements: Extraocular movements intact.  ?   Conjunctiva/sclera:  ?   Right eye: Right conjunctiva is injected. No chemosis, exudate or hemorrhage. ?   Left eye: Left conjunctiva is injected. No chemosis, exudate or hemorrhage. ?   Comments: Slight drainage from  both eyes medially.  ?Cardiovascular:  ?   Rate and Rhythm: Normal rate and regular rhythm.  ?   Heart sounds: Normal heart sounds. No murmur heard. ?  No friction rub. No gallop.  ?Pulmonary:  ?   Effort: Pulmonary effort is normal. No respiratory distress.  ?   Breath sounds: Normal breath sounds. No stridor. No wheezing, rhonchi or rales.  ?Musculoskeletal:  ?   Cervical back: Normal range of motion and neck supple. No rigidity. No muscular tenderness.  ?Neurological:  ?   General: No focal deficit present.  ?   Mental Status: He is alert and oriented to person, place, and time.  ?Psychiatric:     ?   Mood and Affect: Mood normal.     ?   Behavior: Behavior normal.     ?   Thought Content: Thought content normal.     ?   Judgment: Judgment normal.  ? ? ?Results for  orders placed or performed during the hospital encounter of 03/29/22 (from the past 24 hour(s))  ?POCT rapid strep A     Status: None  ? Collection Time: 03/29/22  1:14 PM  ?Result Value Ref Range  ? Rapid Strep A Screen Negative Negative  ? ? ?Assessment and Plan :  ? ?PDMP not reviewed this encounter. ? ?1. Viral upper respiratory infection   ?2. Allergic conjunctivitis and rhinitis, bilateral   ?3. Throat pain   ?4. Sinus congestion   ?5. Eye drainage   ? ?Patient did a COVID test at home and was negative.  Will defer further testing for this.  I have low suspicion for strep throat and therefore will defer culture.  Recommended management with steroid in the context of his severe allergic rhinitis, respiratory symptoms.  Use olopatadine for allergic conjunctivitis and tobramycin to cover for bacterial conjunctivitis.  Recommend supportive care otherwise. Deferred imaging given clear pulmonary exam.  Counseled patient on potential for adverse effects with medications prescribed/recommended today, ER and return-to-clinic precautions discussed, patient verbalized understanding. ?  ?  ?Jaynee Eagles, PA-C ?03/29/22 1337 ? ?

## 2022-03-29 NOTE — Discharge Instructions (Signed)
For the antibiotic eye drops, tobramycin, use 1 drop to both eyes every 4 hours for 1 week. For allergic conjunctivitis use olopatadine twice daily as needed.  Take steroid, prednisone for 5 days to help with your sinus inflammation, coughing. Use the cough syrup as needed as well.  ?

## 2022-04-20 LAB — COLOGUARD: COLOGUARD: NEGATIVE

## 2022-06-04 ENCOUNTER — Emergency Department (HOSPITAL_COMMUNITY): Payer: 59

## 2022-06-04 ENCOUNTER — Encounter (HOSPITAL_COMMUNITY): Payer: Self-pay

## 2022-06-04 ENCOUNTER — Emergency Department (HOSPITAL_COMMUNITY)
Admission: EM | Admit: 2022-06-04 | Discharge: 2022-06-04 | Disposition: A | Discharge status: Home / Self Care | Payer: 59 | Attending: Emergency Medicine | Admitting: Emergency Medicine

## 2022-06-04 ENCOUNTER — Other Ambulatory Visit: Payer: Self-pay

## 2022-06-04 DIAGNOSIS — R079 Chest pain, unspecified: Secondary | ICD-10-CM | POA: Diagnosis present

## 2022-06-04 DIAGNOSIS — I10 Essential (primary) hypertension: Secondary | ICD-10-CM | POA: Diagnosis not present

## 2022-06-04 DIAGNOSIS — Z79899 Other long term (current) drug therapy: Secondary | ICD-10-CM | POA: Diagnosis not present

## 2022-06-04 DIAGNOSIS — R0789 Other chest pain: Secondary | ICD-10-CM | POA: Diagnosis not present

## 2022-06-04 LAB — CBC
HCT: 43.1 % (ref 39.0–52.0)
Hemoglobin: 13.9 g/dL (ref 13.0–17.0)
MCH: 29.4 pg (ref 26.0–34.0)
MCHC: 32.3 g/dL (ref 30.0–36.0)
MCV: 91.3 fL (ref 80.0–100.0)
Platelets: 341 10*3/uL (ref 150–400)
RBC: 4.72 MIL/uL (ref 4.22–5.81)
RDW: 13.3 % (ref 11.5–15.5)
WBC: 9.8 10*3/uL (ref 4.0–10.5)
nRBC: 0 % (ref 0.0–0.2)

## 2022-06-04 LAB — BASIC METABOLIC PANEL
Anion gap: 10 (ref 5–15)
BUN: 17 mg/dL (ref 6–20)
CO2: 24 mmol/L (ref 22–32)
Calcium: 9.7 mg/dL (ref 8.9–10.3)
Chloride: 105 mmol/L (ref 98–111)
Creatinine, Ser: 0.99 mg/dL (ref 0.61–1.24)
GFR, Estimated: >60 mL/min (ref >60)
Glucose, Bld: 111 mg/dL — ABNORMAL HIGH (ref 70–99)
Potassium: 4 mmol/L (ref 3.5–5.1)
Sodium: 139 mmol/L (ref 135–145)

## 2022-06-04 LAB — TROPONIN I (HIGH SENSITIVITY)
Troponin I (High Sensitivity): 3 ng/L (ref <18)
Troponin I (High Sensitivity): 3 ng/L (ref <18)

## 2022-06-04 NOTE — ED Provider Triage Note (Signed)
Emergency Medicine Provider Triage Evaluation Note  Frank Flores , a 51 y.o. male  was evaluated in triage.  Pt complains of chest pain.  Is intermittent, has been having it off and on for a year.  Is left-sided, lasted longer than normal yesterday which is what prompted ED visit today.  Patient is medicine for hypertension and hypercholesterolemia, no other comorbidities.  Review of Systems  Per HPI  Physical Exam  BP (!) 137/91 (BP Location: Right Arm)   Pulse 97   Temp 98.8 F (37.1 C) (Oral)   Resp 16   Ht 5' 11"  (1.803 m)   Wt 106.3 kg   SpO2 93%   BMI 32.69 kg/m  Gen:   Awake, no distress   Resp:  Normal effort  MSK:   Moves extremities without difficulty  Other:    Medical Decision Making  Medically screening exam initiated at 3:53 PM.  Appropriate orders placed.  ALEXIZ SUSTAITA was informed that the remainder of the evaluation will be completed by another provider, this initial triage assessment does not replace that evaluation, and the importance of remaining in the ED until their evaluation is complete.     Sherrill Raring, PA-C 06/04/22 1555

## 2022-06-04 NOTE — ED Triage Notes (Signed)
Patient reports left chest pain x 1 year but worse since last night and this am.  Denies sob.  Radiates into back.

## 2022-06-04 NOTE — ED Provider Notes (Signed)
[facility] EMERGENCY DEPARTMENT Provider Note   CSN: [id number] Arrival date & time: 06/04/22  1538     History  Chief Complaint  Patient presents with   Chest Pain    Frank Flores is a 51 y.o. male.  Patient arrives with chest pain.  Had about 2 to 3-second left-sided chest pain today.  Has been having similar type episodes over the last year.  He has a history of hypertension and high cholesterol but only takes high blood pressure medicine.  He is not having any pain now.  He denies any exertional chest pain or shortness of breath or abdominal pain or nausea, vomiting, diarrhea.  No significant family history of cardiac disease.  He is not a smoker.  Denies any leg swelling, headache, vision changes.  He does a lot of heavy lifting at work.  The history is provided by the patient.       Home Medications Prior to Admission medications   Medication Sig Start Date End Date Taking? Authorizing Provider  albuterol (VENTOLIN HFA) 108 (90 Base) MCG/ACT inhaler INHALE TWO PUFFS INTO THE LUNGS EVERY FOUR HOURS AS NEEDED 02/25/22   Kathyrn Drown, MD  levocetirizine (XYZAL) 5 MG tablet Take 1 tablet (5 mg total) by mouth every evening. 03/29/22   Jaynee Eagles, PA-C  lisinopril (ZESTRIL) 10 MG tablet TAKE ONE TABLET (10MG TOTAL) BY MOUTH DAILY 02/25/22   Kathyrn Drown, MD  metFORMIN (GLUCOPHAGE) 500 MG tablet Take 1 tablet (500 mg total) by mouth 2 (two) times daily with a meal. 03/20/22   Luking, Elayne Snare, MD  olopatadine (PATANOL) 0.1 % ophthalmic solution Place 1 drop into both eyes 2 (two) times daily. 03/29/22   Jaynee Eagles, PA-C  predniSONE (DELTASONE) 20 MG tablet Take 2 tablets daily with breakfast. 03/29/22   Jaynee Eagles, PA-C  promethazine-dextromethorphan (PROMETHAZINE-DM) 6.25-15 MG/5ML syrup Take 5 mLs by mouth 3 (three) times daily as needed for cough. 03/29/22   Jaynee Eagles, PA-C  rosuvastatin (CRESTOR) 10 MG tablet Take 1 tablet (10 mg total) by mouth daily.  02/25/22   Kathyrn Drown, MD  tobramycin (TOBREX) 0.3 % ophthalmic solution Place 1 drop into both eyes every 4 (four) hours. 03/29/22   Jaynee Eagles, PA-C      Allergies    Penicillins    Review of Systems   Review of Systems  Physical Exam Updated Vital Signs BP (!) 133/93   Pulse 70   Temp 98.8 F (37.1 C) (Oral)   Resp 16   Ht 5' 11"  (1.803 m)   Wt 106.3 kg   SpO2 100%   BMI 32.69 kg/m  Physical Exam Vitals and nursing note reviewed.  Constitutional:      General: He is not in acute distress.    Appearance: He is well-developed.  HENT:     Head: Normocephalic and atraumatic.  Eyes:     Conjunctiva/sclera: Conjunctivae normal.  Cardiovascular:     Rate and Rhythm: Normal rate and regular rhythm.     Heart sounds: No murmur heard. Pulmonary:     Effort: Pulmonary effort is normal. No respiratory distress.     Breath sounds: Normal breath sounds.  Chest:     Chest wall: Tenderness present.  Abdominal:     Palpations: Abdomen is soft.     Tenderness: There is no abdominal tenderness.  Musculoskeletal:        General: No swelling.     Cervical back: Neck supple.  Skin:    General: Skin is warm and dry.     Capillary Refill: Capillary refill takes less than 2 seconds.  Neurological:     Mental Status: He is alert.  Psychiatric:        Mood and Affect: Mood normal.     ED Results / Procedures / Treatments   Labs (all labs ordered are listed, but only abnormal results are displayed) Labs Reviewed  BASIC METABOLIC PANEL - Abnormal; Notable for the following components:      Result Value   Glucose, Bld 111 (*)    All other components within normal limits  CBC  TROPONIN I (HIGH SENSITIVITY)  TROPONIN I (HIGH SENSITIVITY)    EKG EKG Interpretation  Date/Time:  Thursday June 04 2022 15:44:45 EDT Ventricular Rate:  94 PR Interval:  146 QRS Duration: 94 QT Interval:  372 QTC Calculation: 465 R Axis:   31 Text Interpretation: Normal sinus rhythm Normal  ECG No previous ECGs available Confirmed by Lennice Sites ([id number]) on 06/04/2022 9:03:33 PM  Radiology DG Chest 2 View  Result Date: 06/04/2022 CLINICAL DATA:  Left-sided chest pain over the last year, worsening recently. EXAM: CHEST - 2 VIEW COMPARISON:  None Available. FINDINGS: The heart size and mediastinal contours are within normal limits. Both lungs are clear. The visualized skeletal structures are unremarkable. IMPRESSION: No active cardiopulmonary disease. Electronically Signed   By: Nelson Chimes M.D.   On: 06/04/2022 16:26    Procedures Procedures    Medications Ordered in ED Medications - No data to display  ED Course/ Medical Decision Making/ A&P                           Medical Decision Making  Frank Flores is here with chest pain.  He of hypertension high cholesterol.  Only takes medicine for high blood pressure.  Heart score 3.  Overall atypical sounding chest pain.  Somewhat reproducible on exam.  Works doing some heavy lifting at times.  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  No leg swelling.  No shortness of breath.  Overall appears well.  He does not have any pain now.  He has had some episodes of very short 2 to 3-second left-sided chest pain.  Does admit to anxiety and stress.  No smoking history.  No significant cardiac history in the family.  Overall suspect differential diagnosis is likely musculoskeletal process versus anxiety versus less likely ACS or PE or pneumonia.  We will get CBC, troponin, BNP, chest x-ray.  EKG per my review and interpretation shows sinus rhythm.  No ischemic changes.  Troponin negative x2.  Chest x-ray shows no evidence of pneumonia or pneumothorax.  Per further review and interpretation of labs there is no significant anemia, electrolyte abnormality, kidney injury.  I have no concern for PE.  He has no respiratory symptoms.  No tachycardia.  Overall suspect MSK issue but given some of his risk factors we will have him  follow-up with cardiology outpatient.  Understands return precautions and discharged in ED in good condition.  This chart was dictated using voice recognition software.  Despite best efforts to proofread,  errors can occur which can change the documentation meaning.         Final Clinical Impression(s) / ED Diagnoses Final diagnoses:  Atypical chest pain    Rx / DC Orders ED Discharge Orders  Ordered    Ambulatory referral to Cardiology       Comments: If you have not heard from the Cardiology office within the next 72 hours please call [phone].   06/04/22 [place], Douglas, DO 06/04/22 2222

## 2022-06-04 NOTE — ED Notes (Signed)
ED Provider at bedside. 

## 2022-07-16 LAB — BASIC METABOLIC PANEL
BUN/Creatinine Ratio: 19 (ref 9–20)
BUN: 22 mg/dL (ref 6–24)
CO2: 24 mmol/L (ref 20–29)
Calcium: 10.1 mg/dL (ref 8.7–10.2)
Chloride: 101 mmol/L (ref 96–106)
Creatinine, Ser: 1.17 mg/dL (ref 0.76–1.27)
Glucose: 102 mg/dL — ABNORMAL HIGH (ref 70–99)
Potassium: 4.8 mmol/L (ref 3.5–5.2)
Sodium: 140 mmol/L (ref 134–144)
eGFR: 76 mL/min/{1.73_m2} (ref >59)

## 2022-07-16 LAB — LIPID PANEL
Chol/HDL Ratio: 2.8 ratio (ref 0.0–5.0)
Cholesterol, Total: 173 mg/dL (ref 100–199)
HDL: 62 mg/dL (ref >39)
LDL Chol Calc (NIH): 94 mg/dL (ref 0–99)
Triglycerides: 96 mg/dL (ref 0–149)
VLDL Cholesterol Cal: 17 mg/dL (ref 5–40)

## 2022-07-16 LAB — MICROALBUMIN / CREATININE URINE RATIO
Creatinine, Urine: 261.8 mg/dL
Microalb/Creat Ratio: 5 mg/g creat (ref 0–29)
Microalbumin, Urine: 13.9 ug/mL

## 2022-07-16 LAB — HEMOGLOBIN A1C
Est. average glucose Bld gHb Est-mCnc: 128 mg/dL
Hgb A1c MFr Bld: 6.1 % — ABNORMAL HIGH (ref 4.8–5.6)

## 2022-07-22 ENCOUNTER — Encounter: Payer: Self-pay | Admitting: Family Medicine

## 2022-07-22 ENCOUNTER — Ambulatory Visit: Payer: 59 | Admitting: Family Medicine

## 2022-07-22 VITALS — BP 100/60 | HR 77 | Temp 97.3°F | Ht 71.0 in | Wt 229.0 lb

## 2022-07-22 DIAGNOSIS — E1169 Type 2 diabetes mellitus with other specified complication: Secondary | ICD-10-CM

## 2022-07-22 DIAGNOSIS — I10 Essential (primary) hypertension: Secondary | ICD-10-CM | POA: Diagnosis not present

## 2022-07-22 DIAGNOSIS — E7849 Other hyperlipidemia: Secondary | ICD-10-CM

## 2022-07-22 DIAGNOSIS — E785 Hyperlipidemia, unspecified: Secondary | ICD-10-CM

## 2022-07-22 DIAGNOSIS — E119 Type 2 diabetes mellitus without complications: Secondary | ICD-10-CM | POA: Diagnosis not present

## 2022-07-22 MED ORDER — METFORMIN HCL 500 MG PO TABS: 500.0000 mg | ORAL_TABLET | Freq: Two times a day (BID) | ORAL | 5 refills | Status: DC

## 2022-07-22 MED ORDER — LISINOPRIL 5 MG PO TABS: ORAL_TABLET | ORAL | 6 refills | Status: DC

## 2022-07-22 MED ORDER — ROSUVASTATIN CALCIUM 20 MG PO TABS: 20.0000 mg | ORAL_TABLET | Freq: Every day | ORAL | 6 refills | Status: DC

## 2022-07-22 NOTE — Progress Notes (Signed)
   Subjective:    Patient ID: Frank Flores, male    DOB: [birth year], 51 y.o.   MRN: [id number]  Hypertension This is a chronic problem. Treatments tried: lisinopril.  Diabetes He presents for his follow-up diabetic visit. He has type 2 diabetes mellitus. Current diabetic treatments: metformin.    Patient has done a fantastic job watching diet staying active losing weight.  His weight is doing much better his numbers look much better as well he is taking his medicines regular basis.  He is staying very active with his job and also does a lot of walking and golf  Results for orders placed or performed during the hospital encounter of 06/04/22  Basic metabolic panel  Result Value Ref Range   Sodium 139 135 - 145 mmol/L   Potassium 4.0 3.5 - 5.1 mmol/L   Chloride 105 98 - 111 mmol/L   CO2 24 22 - 32 mmol/L   Glucose, Bld 111 (H) 70 - 99 mg/dL   BUN 17 6 - 20 mg/dL   Creatinine, Ser 1.24 0.61 - 1.24 mg/dL   Calcium 9.7 8.9 - 58.0 mg/dL   GFR, Estimated >99 >83 mL/min   Anion gap 10 5 - 15  CBC  Result Value Ref Range   WBC 9.8 4.0 - 10.5 K/uL   RBC 4.72 4.22 - 5.81 MIL/uL   Hemoglobin 13.9 13.0 - 17.0 g/dL   HCT 38.2 50.5 - 39.7 %   MCV 91.3 80.0 - 100.0 fL   MCH 29.4 26.0 - 34.0 pg   MCHC 32.3 30.0 - 36.0 g/dL   RDW 67.3 41.9 - 37.9 %   Platelets 341 150 - 400 K/uL   nRBC 0.0 0.0 - 0.2 %  Troponin I (High Sensitivity)  Result Value Ref Range   Troponin I (High Sensitivity) 3 <18 ng/L  Troponin I (High Sensitivity)  Result Value Ref Range   Troponin I (High Sensitivity) 3 <18 ng/L      Review of Systems     Objective:   Physical Exam General-in no acute distress Eyes-no discharge Lungs-respiratory rate normal, CTA CV-no murmurs,RRR Extremities skin warm dry no edema Neuro grossly normal Behavior normal, alert        Assessment & Plan:  1. Essential hypertension Blood pressure good control continue current measures with his blood pressure on the low end  he can reduce the lisinopril from 10 mg down to 5 mg - lisinopril (ZESTRIL) 5 MG tablet; TAKE ONE TABLET 5 mg BY MOUTH DAILY  Dispense: 30 tablet; Refill: 6  2. Other hyperlipidemia Continue statin.  Bump up the dose now use 20 mg.  Check lab work before next visit if has side effects with medicine to let us know side effects educated  3. Controlled type 2 diabetes mellitus without complication, without long-term current use of insulin (HCC) A1c looks great watch diet continue medication stay active keep weight down  4. Hyperlipidemia associated with type 2 diabetes mellitus (HCC) Cholesterol much improved but would like to get LDL below 70.  Bump up dose of statin

## 2022-07-22 NOTE — Progress Notes (Signed)
Message placed in reminder file  

## 2022-07-22 NOTE — Patient Instructions (Signed)
Hi Birdie Riddle job! Keep up the good work See you in Jan. 2024 Take care Dr Lorin Picket

## 2022-07-24 NOTE — Progress Notes (Deleted)
Cardiology Office Note:    Date:  07/24/2022   ID:  Frank Ras Flores, DOB 1971-05-16, MRN [id number]  PCP:  Babs Sciara, MD   [facility] Providers Cardiologist:  None   Referring MD: Virgina Norfolk, DO    History of Present Illness:    Frank Flores is a 51 y.o. male with a hx of HTN, DMII, HLD and asthma who was referred by Dr. Mardene Speak for further evaluation of chest pain.  Patient was seen in the ER onb 06/04/22 for episodes of left sided chest pain lasting 2-3s. Note reviewed. Trop negative x2. ECG nonischemic. CXR without acute pathology. Given reassuring work-up, he was discharged home with CV follow-up.  Today, ***  Past Medical History:  Diagnosis Date   Allergy    seasonal   Asthma    minor   Hypertension    2015    No past surgical history on file.  Current Medications: No outpatient medications have been marked as taking for the 07/29/22 encounter (Appointment) with Meriam Sprague, MD.     Allergies:   Penicillins   Social History   Socioeconomic History   Marital status: Married    Spouse name: Not on file   Number of children: Not on file   Years of education: Not on file   Highest education level: Not on file  Occupational History   Not on file  Tobacco Use   Smoking status: Never   Smokeless tobacco: Never  Substance and Sexual Activity   Alcohol use: Yes   Drug use: Never   Sexual activity: Yes    Birth control/protection: None  Other Topics Concern   Not on file  Social History Narrative   Not on file   Social Determinants of Health   Financial Resource Strain: Not on file  Food Insecurity: Not on file  Transportation Needs: Not on file  Physical Activity: Not on file  Stress: Not on file  Social Connections: Not on file     Family History: The patient's ***family history includes Heart disease in his father.  ROS:   Please see the history of present illness.    *** All other systems reviewed and  are negative.  EKGs/Labs/Other Studies Reviewed:    The following studies were reviewed today: ***  EKG:  EKG is *** ordered today.  The ekg ordered today demonstrates ***  Recent Labs: 03/11/2022: ALT 44; TSH 1.240 06/04/2022: Hemoglobin 13.9; Platelets 341 07/15/2022: BUN 22; Creatinine, Ser 1.17; Potassium 4.8; Sodium 140  Recent Lipid Panel    Component Value Date/Time   CHOL 173 07/15/2022 0943   TRIG 96 07/15/2022 0943   HDL 62 07/15/2022 0943   CHOLHDL 2.8 07/15/2022 0943   LDLCALC 94 07/15/2022 0943     Risk Assessment/Calculations:   {Does this patient have ATRIAL FIBRILLATION?:[phone]}       Physical Exam:    VS:  There were no vitals taken for this visit.    Wt Readings from Last 3 Encounters:  07/22/22 229 lb (103.9 kg)  06/04/22 234 lb 6.4 oz (106.3 kg)  03/18/22 258 lb (117 kg)     GEN: *** Well nourished, well developed in no acute distress HEENT: Normal NECK: No JVD; No carotid bruits LYMPHATICS: No lymphadenopathy CARDIAC: ***RRR, no murmurs, rubs, gallops RESPIRATORY:  Clear to auscultation without rales, wheezing or rhonchi  ABDOMEN: Soft, non-tender, non-distended MUSCULOSKELETAL:  No edema; No deformity  SKIN: Warm and dry NEUROLOGIC:  Alert  and oriented x 3 PSYCHIATRIC:  Normal affect   ASSESSMENT:    No diagnosis found. PLAN:    In order of problems listed above:  #Atypical Chest Pain: Patient with episodes of left sided chest discomfort that had been ongoing intermittently for the past year. Work-up in ER reassuring.  -Check coronary CTA  #HTN: -Continue lisinopril 5mg  daily  #HLD: -Continue crestor 20mg  daily  #DMII: -Continue metformin 500mg  BID      {Are you ordering a CV Procedure (e.g. stress test, cath, DCCV, TEE, etc)?   Press F2        :    Medication Adjustments/Labs and Tests Ordered: Current medicines are reviewed at length with the patient today.  Concerns regarding medicines are outlined above.   No orders of the defined types were placed in this encounter.  No orders of the defined types were placed in this encounter.   There are no Patient Instructions on file for this visit.   Signed, , MD  07/24/2022 1:42 PM    [facility]

## 2022-07-29 ENCOUNTER — Ambulatory Visit: Payer: 59 | Admitting: Cardiology

## 2022-08-26 ENCOUNTER — Other Ambulatory Visit: Payer: Self-pay | Admitting: Family Medicine

## 2022-08-26 DIAGNOSIS — J4540 Moderate persistent asthma, uncomplicated: Secondary | ICD-10-CM

## 2022-09-28 ENCOUNTER — Ambulatory Visit: Payer: 59 | Admitting: Family Medicine

## 2022-09-28 ENCOUNTER — Encounter: Payer: Self-pay | Admitting: Family Medicine

## 2022-09-28 VITALS — BP 110/69 | HR 110 | Temp 99.9°F | Wt 238.2 lb

## 2022-09-28 DIAGNOSIS — B9789 Other viral agents as the cause of diseases classified elsewhere: Secondary | ICD-10-CM

## 2022-09-28 DIAGNOSIS — J029 Acute pharyngitis, unspecified: Secondary | ICD-10-CM

## 2022-09-28 DIAGNOSIS — J988 Other specified respiratory disorders: Secondary | ICD-10-CM | POA: Diagnosis not present

## 2022-09-28 LAB — POCT RAPID STREP A (OFFICE): Rapid Strep A Screen: NEGATIVE

## 2022-09-28 MED ORDER — NAPROXEN 500 MG PO TABS: 500.0000 mg | ORAL_TABLET | Freq: Two times a day (BID) | ORAL | 0 refills | Status: DC | PRN

## 2022-09-28 MED ORDER — IPRATROPIUM BROMIDE 0.06 % NA SOLN: 2.0000 | Freq: Four times a day (QID) | NASAL | 0 refills | Status: DC | PRN

## 2022-09-28 MED ORDER — NAPROXEN 500 MG PO TABS: 500.0000 mg | ORAL_TABLET | Freq: Two times a day (BID) | ORAL | 0 refills | Status: AC | PRN

## 2022-09-28 NOTE — Progress Notes (Signed)
Subjective:  Patient ID: Frank Flores, male    DOB: November 27, 1971  Age: 51 y.o. MRN: [id number]  CC: Chief Complaint  Patient presents with   Sinusitis    Pt having head congestion, sore throat, cold chills last night. Sore throat began early yesterday morning.     HPI:  51 year old male presents for evaluation of the above.  Patient states that he has been sick since Sunday morning.  He has recently traveled to [place].  Just got back home.  He reports sore throat, congestion.  He states that he feels stopped up.  He has had a low-grade temperature, Tmax 99.9.  No relieving factors.  He states that he was unable to go to work.  He has to travel extensively for work.  No other associated symptoms.  No other complaints.  Patient Active Problem List   Diagnosis Date Noted   Viral respiratory infection 09/28/2022   Controlled type 2 diabetes mellitus without complication, without long-term current use of insulin (HCC) 03/18/2022   Moderate persistent asthma without complication 12/04/2020   Prediabetes 01/06/2019   Essential hypertension 12/30/2016   Stress at work 12/30/2016   Other hyperlipidemia 12/30/2016    Social Hx   Social History   Socioeconomic History   Marital status: Married    Spouse name: Not on file   Number of children: Not on file   Years of education: Not on file   Highest education level: Not on file  Occupational History   Not on file  Tobacco Use   Smoking status: Never   Smokeless tobacco: Never  Substance and Sexual Activity   Alcohol use: Yes   Drug use: Never   Sexual activity: Yes    Birth control/protection: None  Other Topics Concern   Not on file  Social History Narrative   Not on file   Social Determinants of Health   Financial Resource Strain: Not on file  Food Insecurity: Not on file  Transportation Needs: Not on file  Physical Activity: Not on file  Stress: Not on file  Social Connections: Not on file    Review of  Systems Per HPI  Objective:  BP 110/69   Pulse (!) 110   Temp 99.9 F (37.7 C)   Wt 238 lb 3.2 oz (108 kg)   SpO2 98%   BMI 33.22 kg/m      10 /01/2022    4:02 PM 07/22/2022    7:54 AM 06/04/2022   11:20 PM  BP/Weight  Systolic BP 882 800 349  Diastolic BP 69 60 88  Wt. (Lbs) 238.2 229   BMI 33.22 kg/m2 31.94 kg/m2     Physical Exam Vitals and nursing note reviewed.  Constitutional:      General: He is not in acute distress.    Appearance: Normal appearance.  HENT:     Head: Normocephalic and atraumatic.     Right Ear: Tympanic membrane normal.     Left Ear: Tympanic membrane normal.     Mouth/Throat:     Pharynx: Posterior oropharyngeal erythema present.  Eyes:     General:        Right eye: No discharge.        Left eye: No discharge.     Conjunctiva/sclera: Conjunctivae normal.  Cardiovascular:     Rate and Rhythm: Regular rhythm. Tachycardia present.  Pulmonary:     Effort: Pulmonary effort is normal.     Breath sounds: Normal breath sounds. No wheezing, rhonchi or  rales.  Neurological:     Mental Status: He is alert.  Psychiatric:        Mood and Affect: Mood normal.        Behavior: Behavior normal.     Lab Results  Component Value Date   WBC 9.8 06/04/2022   HGB 13.9 06/04/2022   HCT 43.1 06/04/2022   PLT 341 06/04/2022   GLUCOSE 102 (H) 07/15/2022   CHOL 173 07/15/2022   TRIG 96 07/15/2022   HDL 62 07/15/2022   LDLCALC 94 07/15/2022   ALT 44 03/11/2022   AST 43 (H) 03/11/2022   NA 140 07/15/2022   K 4.8 07/15/2022   CL 101 07/15/2022   CREATININE 1.17 07/15/2022   BUN 22 07/15/2022   CO2 24 07/15/2022   TSH 1.240 03/11/2022   HGBA1C 6.1 (H) 07/15/2022     Assessment & Plan:   Problem List Items Addressed This Visit       Respiratory   Viral respiratory infection - Primary    Recent travel.  Concern for COVID or possibly influenza.  Awaiting test results.  Rapid strep was negative today.  Treating symptomatically with naproxen and  Atrovent nasal spray.  Work note given.      Relevant Orders   COVID-19, Flu A+B and RSV   Other Visit Diagnoses     Sore throat       Relevant Orders   POCT rapid strep A (Completed)       Meds ordered this encounter  Medications   DISCONTD: ipratropium (ATROVENT) 0.06 % nasal spray    Sig: Place 2 sprays into both nostrils 4 (four) times daily as needed for rhinitis.    Dispense:  15 mL    Refill:  0   DISCONTD: naproxen (NAPROSYN) 500 MG tablet    Sig: Take 1 tablet (500 mg total) by mouth 2 (two) times daily as needed for moderate pain or headache.    Dispense:  30 tablet    Refill:  0   naproxen (NAPROSYN) 500 MG tablet    Sig: Take 1 tablet (500 mg total) by mouth 2 (two) times daily as needed for moderate pain or headache.    Dispense:  30 tablet    Refill:  0   ipratropium (ATROVENT) 0.06 % nasal spray    Sig: Place 2 sprays into both nostrils 4 (four) times daily as needed for rhinitis.    Dispense:  15 mL    Refill:  [place]

## 2022-09-28 NOTE — Patient Instructions (Signed)
Concern for COVID.  Strep negative.  Too early to call this sinusitis (awaiting COVID, flu testing).  Medications as prescribed.  Rest and lots of fluids.  Take care  Dr. Lacinda Axon

## 2022-09-28 NOTE — Assessment & Plan Note (Signed)
Recent travel.  Concern for COVID or possibly influenza.  Awaiting test results.  Rapid strep was negative today.  Treating symptomatically with naproxen and Atrovent nasal spray.  Work note given.

## 2022-09-30 ENCOUNTER — Other Ambulatory Visit: Payer: Self-pay | Admitting: Family Medicine

## 2022-09-30 ENCOUNTER — Other Ambulatory Visit: Payer: Self-pay

## 2022-09-30 ENCOUNTER — Encounter: Payer: Self-pay | Admitting: Family Medicine

## 2022-09-30 LAB — COVID-19, FLU A+B AND RSV
Influenza A, NAA: NOT DETECTED
Influenza B, NAA: NOT DETECTED
RSV, NAA: NOT DETECTED
SARS-CoV-2, NAA: DETECTED — AB

## 2022-09-30 MED ORDER — NIRMATRELVIR/RITONAVIR (PAXLOVID)TABLET: 3.0000 | ORAL_TABLET | Freq: Two times a day (BID) | ORAL | 0 refills | Status: DC

## 2022-09-30 MED ORDER — NIRMATRELVIR/RITONAVIR (PAXLOVID)TABLET: 3.0000 | ORAL_TABLET | Freq: Two times a day (BID) | ORAL | 0 refills | Status: AC

## 2022-10-07 ENCOUNTER — Ambulatory Visit: Payer: 59 | Admitting: Cardiovascular Disease

## 2022-10-30 ENCOUNTER — Other Ambulatory Visit: Payer: Self-pay | Admitting: Family Medicine

## 2022-10-30 DIAGNOSIS — J4540 Moderate persistent asthma, uncomplicated: Secondary | ICD-10-CM

## 2022-11-25 ENCOUNTER — Ambulatory Visit: Payer: 59 | Admitting: Internal Medicine

## 2022-12-31 ENCOUNTER — Other Ambulatory Visit: Payer: Self-pay | Admitting: Family Medicine

## 2022-12-31 NOTE — Telephone Encounter (Signed)
Please contact pt to have him schedule appt; then may route to nurses. Thank you

## 2023-01-06 ENCOUNTER — Telehealth: Payer: Self-pay

## 2023-01-06 NOTE — Telephone Encounter (Signed)
Pt appt is scheduled appt for Wed 17th at 8:00 am needs refill on med

## 2023-01-06 NOTE — Telephone Encounter (Signed)
Pt needs blood work ordered will have lab work done when he comes in next [employer]

## 2023-01-06 NOTE — Telephone Encounter (Signed)
Metabolic 7, lipid, liver, A1c, urine ACR, PSA HTN hyperlipidemia diabetes screening prostate cancer

## 2023-01-07 ENCOUNTER — Other Ambulatory Visit: Payer: Self-pay

## 2023-01-07 DIAGNOSIS — Z125 Encounter for screening for malignant neoplasm of prostate: Secondary | ICD-10-CM

## 2023-01-07 DIAGNOSIS — I10 Essential (primary) hypertension: Secondary | ICD-10-CM

## 2023-01-07 DIAGNOSIS — E1169 Type 2 diabetes mellitus with other specified complication: Secondary | ICD-10-CM

## 2023-01-07 DIAGNOSIS — E119 Type 2 diabetes mellitus without complications: Secondary | ICD-10-CM

## 2023-01-07 NOTE — Telephone Encounter (Signed)
Patient has been informed per provider orders.

## 2023-01-11 NOTE — Telephone Encounter (Signed)
Pt has med check appt on 01/24

## 2023-01-11 NOTE — Telephone Encounter (Signed)
30 day supply sent to pharmacy.  

## 2023-01-13 ENCOUNTER — Ambulatory Visit: Payer: 59 | Admitting: Family Medicine

## 2023-01-14 LAB — LIPID PANEL
Chol/HDL Ratio: 3.1 ratio (ref 0.0–5.0)
Cholesterol, Total: 163 mg/dL (ref 100–199)
HDL: 52 mg/dL (ref >39)
LDL Chol Calc (NIH): 90 mg/dL (ref 0–99)
Triglycerides: 118 mg/dL (ref 0–149)
VLDL Cholesterol Cal: 21 mg/dL (ref 5–40)

## 2023-01-14 LAB — BASIC METABOLIC PANEL
BUN/Creatinine Ratio: 17 (ref 9–20)
BUN: 17 mg/dL (ref 6–24)
CO2: 24 mmol/L (ref 20–29)
Calcium: 10.4 mg/dL — ABNORMAL HIGH (ref 8.7–10.2)
Chloride: 102 mmol/L (ref 96–106)
Creatinine, Ser: 1.02 mg/dL (ref 0.76–1.27)
Glucose: 113 mg/dL — ABNORMAL HIGH (ref 70–99)
Potassium: 5.3 mmol/L — ABNORMAL HIGH (ref 3.5–5.2)
Sodium: 142 mmol/L (ref 134–144)
eGFR: 90 mL/min/{1.73_m2} (ref >59)

## 2023-01-14 LAB — HEPATIC FUNCTION PANEL
ALT: 36 IU/L (ref 0–44)
AST: 29 IU/L (ref 0–40)
Albumin: 4.7 g/dL (ref 4.1–5.1)
Alkaline Phosphatase: 74 IU/L (ref 44–121)
Bilirubin Total: 0.5 mg/dL (ref 0.0–1.2)
Bilirubin, Direct: 0.15 mg/dL (ref 0.00–0.40)
Total Protein: 7.2 g/dL (ref 6.0–8.5)

## 2023-01-14 LAB — PSA: Prostate Specific Ag, Serum: 0.7 ng/mL (ref 0.0–4.0)

## 2023-01-14 LAB — HEMOGLOBIN A1C
Est. average glucose Bld gHb Est-mCnc: 134 mg/dL
Hgb A1c MFr Bld: 6.3 % — ABNORMAL HIGH (ref 4.8–5.6)

## 2023-01-14 LAB — MICROALBUMIN / CREATININE URINE RATIO
Creatinine, Urine: 163.8 mg/dL
Microalb/Creat Ratio: 4 mg/g creat (ref 0–29)
Microalbumin, Urine: 6.6 ug/mL

## 2023-01-20 ENCOUNTER — Ambulatory Visit: Payer: 59 | Admitting: Family Medicine

## 2023-01-20 ENCOUNTER — Encounter: Payer: Self-pay | Admitting: Family Medicine

## 2023-01-20 VITALS — BP 110/70 | HR 88 | Temp 98.0°F | Ht 71.0 in | Wt 243.0 lb

## 2023-01-20 DIAGNOSIS — E875 Hyperkalemia: Secondary | ICD-10-CM

## 2023-01-20 DIAGNOSIS — I10 Essential (primary) hypertension: Secondary | ICD-10-CM

## 2023-01-20 DIAGNOSIS — E7849 Other hyperlipidemia: Secondary | ICD-10-CM | POA: Diagnosis not present

## 2023-01-20 DIAGNOSIS — E1169 Type 2 diabetes mellitus with other specified complication: Secondary | ICD-10-CM

## 2023-01-20 DIAGNOSIS — E119 Type 2 diabetes mellitus without complications: Secondary | ICD-10-CM

## 2023-01-20 DIAGNOSIS — E669 Obesity, unspecified: Secondary | ICD-10-CM

## 2023-01-20 MED ORDER — LISINOPRIL 5 MG PO TABS: ORAL_TABLET | ORAL | 6 refills | Status: DC

## 2023-01-20 MED ORDER — CITALOPRAM HYDROBROMIDE 20 MG PO TABS: 20.0000 mg | ORAL_TABLET | Freq: Every day | ORAL | 6 refills | Status: DC

## 2023-01-20 MED ORDER — ROSUVASTATIN CALCIUM 20 MG PO TABS: 20.0000 mg | ORAL_TABLET | Freq: Every day | ORAL | 6 refills | Status: DC

## 2023-01-20 MED ORDER — METFORMIN HCL 500 MG PO TABS: 500.0000 mg | ORAL_TABLET | Freq: Two times a day (BID) | ORAL | 6 refills | Status: DC

## 2023-01-20 NOTE — Progress Notes (Signed)
Subjective:    Patient ID: Frank Flores, male    DOB: April 14, 1971, 52 y.o.   MRN: [id number]  HPI Follow up HTN, DM, hyperlipdemia  Patient for blood pressure check up.  The patient does have hypertension.   Patient relates dietary measures try to minimize salt The importance of healthy diet and activity were discussed Patient relates compliance  The patient was seen today as part of a comprehensive diabetic check up. Patient has diabetes Patient relates good compliance with taking the medication. We discussed their diet and exercise activities  We also discussed the importance of notifying us if any excessively high glucoses or low sugars.    Patient here for follow-up regarding cholesterol.    Patient relates taking medication on a regular basis Denies problems with medication Importance of dietary measures discussed Regular lab work regarding lipid and liver was checked and if needing additional labs was appropriately ordered  The patient's BMI is calculated.  The patient does have obesity.  The patient does try to some degree staying active and watching diet.  It is in the vital signs and acknowledged.  It is above the recommended BMI for the patient's height and weight.  The patient has been counseled regarding healthy diet, restricted portions, avoiding excessive carbohydrates/sugary foods, and increase physical activity as health permits.  It is in the patient's best interest to lower the risk of secondary illness including heart disease strokes and cancer by losing weight.  The patient acknowledges this information.  Review of Systems     Objective:   Physical Exam General-in no acute distress Eyes-no discharge Lungs-respiratory rate normal, CTA CV-no murmurs,RRR Extremities skin warm dry no edema Neuro grossly normal Behavior normal, alert        Assessment & Plan:  1. Essential hypertension HTN- patient seen for follow-up regarding HTN.   Diet,  medication compliance, appropriate labs and refills were completed.   Importance of keeping blood pressure under good control to lessen the risk of complications discussed Regular follow-up visits discussed  - lisinopril (ZESTRIL) 5 MG tablet; TAKE ONE TABLET 5 mg BY MOUTH DAILY  Dispense: 30 tablet; Refill: 6 - Hemoglobin A1c - Lipid Panel - Basic Metabolic Panel - Hepatic Function Panel - Intact PTH (Includes Calcium)  2. Other hyperlipidemia Hyperlipidemia-importance of diet, weight control, activity, compliance with medications discussed.   Recent labs reviewed.   Any additional labs or refills ordered.   Importance of keeping under good control discussed. Regular follow-up visits discussed   3. Controlled type 2 diabetes mellitus without complication, without long-term current use of insulin ([place]) The patient was seen today as part of a comprehensive visit for diabetes. The importance of keeping her A1c at or below 7 range was discussed.  Discussed diet, activity, and medication compliance Emphasized healthy eating primarily with vegetables fruits and if utilizing meats lean meats such as chicken or fish grilled baked broiled Avoid sugary drinks Minimize and avoid processed foods Fit in regular physical activity preferably 25 to 30 minutes 4 times per week Standard follow-up visit recommended.  Patient aware lack of control and follow-up increases risk of diabetic complications. Regular follow-up visits Yearly ophthalmology Yearly foot exam  - Hemoglobin A1c  4. Hypercalcemia Calcium elevated we will go ahead and check PTH as well as ionized calcium - Basic Metabolic Panel  5. Hyperkalemia Recheck metabolic 7 minimize bananas and potassium rich foods - Hemoglobin A1c - Lipid Panel - Basic Metabolic Panel - Hepatic Function Panel -  Intact PTH (Includes Calcium)  6. Obesity (BMI 30.0-34.9) Portion control regular physical activity  Patient states that he went on a  trip over the holidays and states that he consumed a fair amount of calorie rich foods that may well have caused all of his numbers to go higher and his weight to go higher he is going to work hard on healthy eating regular activity try to bring this down

## 2023-02-09 ENCOUNTER — Other Ambulatory Visit: Payer: Self-pay | Admitting: Family Medicine

## 2023-02-09 DIAGNOSIS — J4540 Moderate persistent asthma, uncomplicated: Secondary | ICD-10-CM

## 2023-02-10 NOTE — Telephone Encounter (Signed)
May have 6 months on both

## 2023-03-11 ENCOUNTER — Other Ambulatory Visit: Payer: Self-pay | Admitting: Family Medicine

## 2023-03-11 DIAGNOSIS — I10 Essential (primary) hypertension: Secondary | ICD-10-CM

## 2023-05-19 ENCOUNTER — Other Ambulatory Visit: Payer: Self-pay | Admitting: Family Medicine

## 2023-06-16 ENCOUNTER — Encounter: Payer: Self-pay | Admitting: Family Medicine

## 2023-06-16 ENCOUNTER — Ambulatory Visit: Payer: 59 | Admitting: Family Medicine

## 2023-06-16 VITALS — BP 124/88 | HR 79 | Ht 71.0 in | Wt 239.4 lb

## 2023-06-16 DIAGNOSIS — I10 Essential (primary) hypertension: Secondary | ICD-10-CM

## 2023-06-16 DIAGNOSIS — E7849 Other hyperlipidemia: Secondary | ICD-10-CM | POA: Diagnosis not present

## 2023-06-16 MED ORDER — ROSUVASTATIN CALCIUM 20 MG PO TABS: ORAL_TABLET | ORAL | 6 refills | Status: DC

## 2023-06-16 MED ORDER — METFORMIN HCL 500 MG PO TABS: 500.0000 mg | ORAL_TABLET | Freq: Two times a day (BID) | ORAL | 6 refills | Status: DC

## 2023-06-16 MED ORDER — LISINOPRIL 5 MG PO TABS: ORAL_TABLET | ORAL | 5 refills | Status: DC

## 2023-06-16 MED ORDER — CITALOPRAM HYDROBROMIDE 20 MG PO TABS: 20.0000 mg | ORAL_TABLET | Freq: Every day | ORAL | 5 refills | Status: DC

## 2023-06-16 NOTE — Progress Notes (Signed)
   Subjective:    Patient ID: Frank Flores, male    DOB: 10/25/1971, 52 y.o.   MRN: [id number]  HPI Patient arrives for follow up. Patient states no concerns or issues.   Patient for blood pressure check up.  The patient does have hypertension.   Patient relates dietary measures try to minimize salt The importance of healthy diet and activity were discussed Patient relates compliance  The patient was seen today as part of a comprehensive diabetic check up. Patient has diabetes Patient relates good compliance with taking the medication. We discussed their diet and exercise activities  We also discussed the importance of notifying us if any excessively high glucoses or low sugars.     Review of Systems     Objective:   Physical Exam  General-in no acute distress Eyes-no discharge Lungs-respiratory rate normal, CTA CV-no murmurs,RRR Extremities skin warm dry no edema Neuro grossly normal Behavior normal, alert       Assessment & Plan:   1. Essential hypertension Decent control continue current measures healthy diet - lisinopril (ZESTRIL) 5 MG tablet; TAKE ONE TABLET (5MG ) BY MOUTH DAILY  Dispense: 30 tablet; Refill: 5  2. Other hyperlipidemia In regards to cholesterol continue medication Labs ordered await results  In regards to diabetes consider GLP-1's side effects discussed, patient will consider, check labs continue current measures  Anxiety depression did not have time to cover this during today's visit but will send him follow-up message  Follow-up within 6 months

## 2023-06-16 NOTE — Patient Instructions (Addendum)
Shingrix and shingles prevention: know the facts!   Shingrix is a very effective vaccine to prevent shingles.   Shingles is a reactivation of chickenpox -more than 99% of Americans born before 1980 have had chickenpox even if they do not remember it. One in every 10 people who get shingles have severe long-lasting nerve pain as a result.   33 out of a 100 older adults will get shingles if they are unvaccinated.     This vaccine is very important for your health This vaccine is indicated for anyone 50 years or older. You can get this vaccine even if you have already had shingles because you can get the disease more than once in a lifetime.  Your risk for shingles and its complications increases with age.  This vaccine has 2 doses.  The second dose would be 2 to 6 months after the first dose.  If you had Zostavax vaccine in the past you should still get Shingrix. ( Zostavax is only 70% effective and it loses significant strength over a few years .)  This vaccine is given through the pharmacy.  The cost of the vaccine is through your insurance. The pharmacy can inform you of the total costs.  Common side effects including soreness in the arm, some redness and swelling, also some feel fatigue muscle soreness headache low-grade fever.  Side effects typically go away within 2 to 3 days. Remember-the pain from shingles can last a lifetime but these side effects of the vaccine will only last a few days at most. It is very important to get both doses in order to protect yourself fully.   Please get this vaccine at your earliest convenience at your trusted pharmacy.        Discussion of GLP-1 medications Please remember these medications are to assist with weight reduction and diabetes control.  They do not take the place of healthy eating and regular physical activity. There are several GLP-1 medications that can help with weight reduction and diabetes control.  GLP-1 medications with  indication for diabetes-Trulicity, Victoza, Bydureon , Mounjaro, Ozempic, and Rybelsus (although these are injected medicines except for Rybelsus which is a pill) GLP-1 medications with indications for weight loss-Wegovy, and Saxenda  Mechanism of action  These medications stimulate glucagon peptide receptors.  By doing so it does the following:  1-slows down stomach emptying-essentially food takes longer to go through the stomach and the intestines-this can lessen appetite  2-reduces glucagon secretion from the pancreas-this helps keep blood glucose levels stable between meals  3-increase his insulin release from the pancreas-with diabetics this helps keep blood glucose levels stable  4-promotes the feeling of being full in the brain-encouraged him receptors in the brain received the signal so the brain and body know that it is time to stop eating Benefits of the medication  1-reduced weight-reduction of weight is more significant at higher dosing.  Not as much weight reduction with Rybelsus   A- typically Wegovy at higher doses can assist with significant weight reduction.  2-improved blood glucose levels-with diabetics typically we see a significant drop with A1c when the medication is adjusted appropriately.  3-decrease risk of heart attacks and strokes-individuals with type 2 diabetes are at increased risk of heart attacks and strokes.  These GLP-1 medications can reduce the risk by 22%.  Risk of GLP-1 medications-these are some of the risks.  It is important to talk with your provider in a shared discussion before starting any medication.  Contraindications to   GLP-1 medications-in other words who should not take these.  1-individuals with history of thyroid medullary cancer  2-individuals with family history of multiple endocrine neoplasm syndrome type II (MEN 2)  3-individuals with a history of pancreatitis  4-those with a history of severe hypersensitivity or allergy reactions to GLP-1  medications  5-should be avoided in individuals with a history of suicidal attempts or active suicidal ideation.  Cautions include- 1- risk of thyroid C-cell tumors were seen in rats during clinical testing in humans the relevancy of this information has not been determined 2-risk of pancreatitis including fatal and nonfatal hemorrhagic or necrotizing pancreatitis 3-gallbladder disease slightly increased risk of gallstones 4-hypoglycemia-more common with individuals who are on insulin or sulfonylurea such as glipizide 5-acute kidney injury or worsening of chronic renal failure often this is triggered by severe vomiting and diarrhea as side effects to GLP-1. 6-slightly increased risk of diabetic retinopathy complications in patients with type 2 diabetes 7-heart rate increase-slight increase of base heart rate with GLP-1 8-suicidal behavior and ideation has been reported in clinical trials with other weight loss medicines.  If depression occurs with medication or suicidal ideation stop medication immediately notify provider or seek help.  Common side effects include nausea, vomiting, diarrhea, constipation and increased heart rate.  Less common side effects severe abdominal pain-if this occurs notify provider stop medication get tested for pancreatitis. Full discussion with your provider should occur before starting these medicines.       

## 2023-11-16 ENCOUNTER — Other Ambulatory Visit: Payer: Self-pay | Admitting: Family Medicine

## 2023-11-16 DIAGNOSIS — J4540 Moderate persistent asthma, uncomplicated: Secondary | ICD-10-CM

## 2023-12-01 ENCOUNTER — Ambulatory Visit: Payer: 59 | Admitting: Family Medicine

## 2023-12-01 VITALS — BP 120/84 | HR 86 | Temp 97.7°F | Ht 71.0 in | Wt 250.4 lb

## 2023-12-01 DIAGNOSIS — Z0000 Encounter for general adult medical examination without abnormal findings: Secondary | ICD-10-CM

## 2023-12-01 DIAGNOSIS — I10 Essential (primary) hypertension: Secondary | ICD-10-CM

## 2023-12-01 DIAGNOSIS — E119 Type 2 diabetes mellitus without complications: Secondary | ICD-10-CM

## 2023-12-01 DIAGNOSIS — M25512 Pain in left shoulder: Secondary | ICD-10-CM | POA: Diagnosis not present

## 2023-12-01 DIAGNOSIS — Z0001 Encounter for general adult medical examination with abnormal findings: Secondary | ICD-10-CM

## 2023-12-01 DIAGNOSIS — Z79899 Other long term (current) drug therapy: Secondary | ICD-10-CM

## 2023-12-01 DIAGNOSIS — E785 Hyperlipidemia, unspecified: Secondary | ICD-10-CM

## 2023-12-01 DIAGNOSIS — Z125 Encounter for screening for malignant neoplasm of prostate: Secondary | ICD-10-CM

## 2023-12-01 DIAGNOSIS — Z7984 Long term (current) use of oral hypoglycemic drugs: Secondary | ICD-10-CM

## 2023-12-01 DIAGNOSIS — E1169 Type 2 diabetes mellitus with other specified complication: Secondary | ICD-10-CM | POA: Diagnosis not present

## 2023-12-01 MED ORDER — METFORMIN HCL 500 MG PO TABS: 500.0000 mg | ORAL_TABLET | Freq: Two times a day (BID) | ORAL | 6 refills | Status: DC

## 2023-12-01 MED ORDER — CITALOPRAM HYDROBROMIDE 20 MG PO TABS: 20.0000 mg | ORAL_TABLET | Freq: Every day | ORAL | 5 refills | Status: DC

## 2023-12-01 MED ORDER — ROSUVASTATIN CALCIUM 20 MG PO TABS: ORAL_TABLET | ORAL | 6 refills | Status: DC

## 2023-12-01 MED ORDER — LISINOPRIL 5 MG PO TABS: ORAL_TABLET | ORAL | 5 refills | Status: DC

## 2023-12-01 NOTE — Progress Notes (Signed)
Subjective:    Patient ID: Frank Flores, male    DOB: 06-Oct-1971, 52 y.o.   MRN: [id number]  Discussed the use of AI scribe software for clinical note transcription with the patient, who gave verbal consent to proceed.  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 The pain has been ongoing since September. He does lift a lot at work. no specific injury. The pain is described as a pulling sensation across the shoulder, particularly noticeable when raising the arm or putting on a jacket. The patient reports that the pain has been persistent and has not improved over time.  In terms of his diabetes management, the patient reports adherence to metformin, taking four pills every morning and an additional pill in the evening while on the road. The patient admits to occasionally forgetting to take the medication. Despite a busy work schedule, the patient maintains a level of physical activity through constant walking during work hours and occasional longer walks on days off. The patient's diet fluctuates, with periods of healthy eating, such as salads and baked potatoes, followed by less healthy choices.  The patient's stress levels are reported as reasonable, with the primary source of stress being work. Mood is generally good, with no significant issues reported in personal relationships. The patient reports no issues with urination or eyesight, using reading glasses only for small print. The patient also carries an albuterol inhaler for occasional use, particularly in cold weather.  The patient has not noticed any blood in his stool and reports no significant safety concerns related to his job. The patient has not received a flu vaccine in the past and is considering whether to get one. The patient also reports a past episode of a persistent cough, which has since resolved.       The  patient comes in today for a wellness visit.    A review of their health history was completed.  A review of medications was also completed.  Any needed refills; medications to be updated  Eating habits: Tries to eat healthy most of the time  Falls/  MVA accidents in past few months: No accidents or injuries tries to be safe and driving  Regular exercise: He stays very active with his job also fits in some golf and some walking  Specialist pt sees on regular basis: None currently  Preventative health issues were discussed.   Additional concerns: Left shoulder pain and discomfort   Review of Systems     Objective:    Physical Exam   HEENT: Tongue normal. Neck palpation normal. CHEST: Lungs clear. CARDIOVASCULAR: Heart sounds regular, no murmurs. MUSCULOSKELETAL: Active shoulder elevation elicited pain, tenderness localized. Limited range of motion in shoulder with pain on active elevation and resistance. NEUROLOGICAL: Sensory awareness intact in feet. SKIN: Feet inspection normal.     Patient defers on prostate exam Positive empty can test left side positive external rotation test left side      Assessment & Plan:  Assessment and Plan    Type 2 Diabetes Mellitus Patient reports inconsistent use of Metformin. No reported hypoglycemic episodes. -Continue Metformin as prescribed. -Encourage consistent use of medication. -Order labs to monitor HbA1c and kidney function.  Shoulder Pain Patient reports pain in the shoulder, possibly due to overuse or strain. No signs of complete rotator cuff tear, but possible inflammation or impingement. -Recommend home exercises for shoulder. -  Refer to orthopedic specialist for further evaluation and possible imaging.  General Health Maintenance -Encourage regular physical activity, such as walking. -Recommend annual eye exam due to diabetes. -Order PSA test as part of wellness exam. -Discuss benefits of flu vaccine and recommend  considering it. -Plan to check feet at each visit for signs of diabetic neuropathy. -Order urine test to check for protein as part of kidney function monitoring. -Consider prostate exam as part of wellness check. -Schedule follow-up in 6 months.     Adult wellness-complete.wellness physical was conducted today. Importance of diet and exercise were discussed in detail.  Importance of stress reduction and healthy living were discussed.  In addition to this a discussion regarding safety was also covered.  We also reviewed over immunizations and gave recommendations regarding current immunization needed for age.   In addition to this additional areas were also touched on including: Preventative health exams needed:  Colonoscopy up-to-date on Cologuard next 1 is 2026  Patient was advised yearly wellness exam  1. Well adult exam Adult wellness-complete.wellness physical was conducted today. Importance of diet and exercise were discussed in detail.  Importance of stress reduction and healthy living were discussed.  In addition to this a discussion regarding safety was also covered.  We also reviewed over immunizations and gave recommendations regarding current immunization needed for age.   In addition to this additional areas were also touched on including: Preventative health exams needed:  Colonoscopy Cologuard up-to-date next 01/16/2025  Patient was advised yearly wellness exam  - Basic Metabolic Panel - Lipid Panel - Hepatic Function Panel - PSA - Microalbumin/Creatinine Ratio, Urine - Hemoglobin A1c  2. Essential hypertension HTN- patient seen for follow-up regarding HTN.   Diet, medication compliance, appropriate labs and refills were completed.   Importance of keeping blood pressure under good control to lessen the risk of complications discussed Regular follow-up visits discussed  - lisinopril (ZESTRIL) 5 MG tablet; TAKE ONE TABLET (5MG ) BY MOUTH DAILY  Dispense: 30  tablet; Refill: 5 - Basic Metabolic Panel  3. Controlled type 2 diabetes mellitus without complication, without long-term current use of insulin (HCC) The patient was seen today as part of a comprehensive visit for diabetes. The importance of keeping her A1c at or below 7 range was discussed.  Discussed diet, activity, and medication compliance Emphasized healthy eating primarily with vegetables fruits and if utilizing meats lean meats such as chicken or fish grilled baked broiled Avoid sugary drinks Minimize and avoid processed foods Fit in regular physical activity preferably 25 to 30 minutes 4 times per week Standard follow-up visit recommended.  Patient aware lack of control and follow-up increases risk of diabetic complications. Regular follow-up visits Yearly ophthalmology Yearly foot exam  - Microalbumin/Creatinine Ratio, Urine - Hemoglobin A1c  4. Hyperlipidemia associated with type 2 diabetes mellitus (HCC) Hyperlipidemia-importance of diet, weight control, activity, compliance with medications discussed.   Recent labs reviewed.   Any additional labs or refills ordered.   Importance of keeping under good control discussed. Regular follow-up visits discussed  - Lipid Panel  5. High risk medication use Continue med - Hepatic Function Panel  6. Screening PSA (prostate specific antigen) Screening PSA - PSA  7. Acute pain of left shoulder Referral to orthopedics patient is going to call back regarding which orthopedics he would like to see Probable rotator cuff strain possible impingement

## 2023-12-03 LAB — HEMOGLOBIN A1C
Est. average glucose Bld gHb Est-mCnc: 163 mg/dL
Hgb A1c MFr Bld: 7.3 % — ABNORMAL HIGH (ref 4.8–5.6)

## 2023-12-03 LAB — LIPID PANEL
Chol/HDL Ratio: 3.5 {ratio} (ref 0.0–5.0)
Cholesterol, Total: 170 mg/dL (ref 100–199)
HDL: 49 mg/dL (ref >39)
LDL Chol Calc (NIH): 95 mg/dL (ref 0–99)
Triglycerides: 150 mg/dL — ABNORMAL HIGH (ref 0–149)
VLDL Cholesterol Cal: 26 mg/dL (ref 5–40)

## 2023-12-03 LAB — HEPATIC FUNCTION PANEL
ALT: 42 [IU]/L (ref 0–44)
AST: 35 [IU]/L (ref 0–40)
Albumin: 4.6 g/dL (ref 3.8–4.9)
Alkaline Phosphatase: 81 [IU]/L (ref 44–121)
Bilirubin Total: 0.5 mg/dL (ref 0.0–1.2)
Bilirubin, Direct: 0.16 mg/dL (ref 0.00–0.40)
Total Protein: 7.2 g/dL (ref 6.0–8.5)

## 2023-12-03 LAB — BASIC METABOLIC PANEL
BUN/Creatinine Ratio: 17 (ref 9–20)
BUN: 17 mg/dL (ref 6–24)
CO2: 24 mmol/L (ref 20–29)
Calcium: 10.2 mg/dL (ref 8.7–10.2)
Chloride: 100 mmol/L (ref 96–106)
Creatinine, Ser: 1.02 mg/dL (ref 0.76–1.27)
Glucose: 153 mg/dL — ABNORMAL HIGH (ref 70–99)
Potassium: 4.9 mmol/L (ref 3.5–5.2)
Sodium: 140 mmol/L (ref 134–144)
eGFR: 89 mL/min/{1.73_m2} (ref >59)

## 2023-12-03 LAB — MICROALBUMIN / CREATININE URINE RATIO
Creatinine, Urine: 125.7 mg/dL
Microalb/Creat Ratio: 4 mg/g{creat} (ref 0–29)
Microalbumin, Urine: 5.1 ug/mL

## 2023-12-03 LAB — PSA: Prostate Specific Ag, Serum: 1.5 ng/mL (ref 0.0–4.0)

## 2023-12-04 ENCOUNTER — Telehealth: Payer: Self-pay | Admitting: Family Medicine

## 2023-12-04 NOTE — Telephone Encounter (Signed)
Front staff-I have signed the form regarding his wellness exam Please connect with the patient to let him know this form has Been completed he may decide to pick it up or have it mailed to him or have it sent to his employer thank you

## 2023-12-08 ENCOUNTER — Encounter: Payer: 59 | Admitting: Physician Assistant

## 2023-12-08 ENCOUNTER — Encounter: Payer: 59 | Admitting: Family Medicine

## 2024-05-31 ENCOUNTER — Encounter: Payer: Self-pay | Admitting: Family Medicine

## 2024-05-31 ENCOUNTER — Ambulatory Visit: Payer: 59 | Admitting: Family Medicine

## 2024-05-31 VITALS — BP 110/80 | HR 84 | Temp 98.1°F | Ht 71.0 in | Wt 253.8 lb

## 2024-05-31 DIAGNOSIS — Z7984 Long term (current) use of oral hypoglycemic drugs: Secondary | ICD-10-CM | POA: Diagnosis not present

## 2024-05-31 DIAGNOSIS — E785 Hyperlipidemia, unspecified: Secondary | ICD-10-CM

## 2024-05-31 DIAGNOSIS — E1169 Type 2 diabetes mellitus with other specified complication: Secondary | ICD-10-CM

## 2024-05-31 DIAGNOSIS — E119 Type 2 diabetes mellitus without complications: Secondary | ICD-10-CM

## 2024-05-31 DIAGNOSIS — Z79899 Other long term (current) drug therapy: Secondary | ICD-10-CM

## 2024-05-31 DIAGNOSIS — I10 Essential (primary) hypertension: Secondary | ICD-10-CM | POA: Diagnosis not present

## 2024-05-31 MED ORDER — METFORMIN HCL 500 MG PO TABS: 500.0000 mg | ORAL_TABLET | Freq: Two times a day (BID) | ORAL | 6 refills | Status: DC

## 2024-05-31 MED ORDER — LISINOPRIL 5 MG PO TABS: ORAL_TABLET | ORAL | 5 refills | Status: DC

## 2024-05-31 MED ORDER — CITALOPRAM HYDROBROMIDE 20 MG PO TABS: 20.0000 mg | ORAL_TABLET | Freq: Every day | ORAL | 5 refills | Status: DC

## 2024-05-31 MED ORDER — ROSUVASTATIN CALCIUM 20 MG PO TABS: ORAL_TABLET | ORAL | 6 refills | Status: DC

## 2024-05-31 NOTE — Progress Notes (Signed)
   Subjective:    Patient ID: Frank Flores, male    DOB: [birth year], 53 y.o.   MRN: [id number]  HPI Pt comes in today for 6 month follow up, pt has no questions or concerns. Would like medications refilled. Medications and allergies reviewed. Discussed the use of AI scribe software for clinical note transcription with the patient, who gave verbal consent to proceed.  History of Present Illness   Frank Flores is a 53 year old male with diabetes who presents for a follow-up visit to discuss blood work and diabetes management.  He missed his previous appointment for blood work due to a scheduling conflict and plans to reschedule it for next week. He is currently taking metformin , one tablet twice a day, for diabetes management. He is overdue for his next eye exam for diabetic retinopathy.  He feels generally well and stays active through his physically demanding job, which involves a lot of movement. He tries to maintain a healthy diet, although he admits to occasional indulgences, such as during a trip to [place] in January. He experienced a setback in his routine after a bout of influenza, which included body aches and fever, causing him to miss a week of work. Since then, he has struggled to return to his previous healthy habits.  His current diet includes oatmeal and protein bars in the morning, and he tries to incorporate vegetables and lean proteins like chicken and small portions of steak into his evening meals. He prefers low-fat dressings for his salads. He sometimes experiences stomach upset after eating salad, which he attributes to 'a bad batch of lettuce'.  He describes his work schedule as demanding, with long hours, but he appreciates the time off it affords him. He has been with his current employer for nearly 24 years and is considering his future work plans as he approaches retirement age. He recalls breaking his toe during a trip to [place], which he managed with band-aids,  and reports that it is doing better now.       Review of Systems     Objective:   Physical Exam General-in no acute distress Eyes-no discharge Lungs-respiratory rate normal, CTA CV-no murmurs,RRR Extremities skin warm dry no edema Neuro grossly normal Behavior normal, alert  Diabetic foot exam normal      Assessment & Plan:  Assessment and Plan    Type 2 Diabetes Mellitus Focused on A1c monitoring. Currently on metformin . Discussed potential treatment adjustments based on lab results. Explained benefits and costs of GLP-1 receptor agonists. Noted 85% tolerance rate with 15% experiencing GI side effects. - Order A1c test. - Continue metformin . - Consider increasing metformin  dose or initiating GLP-1 receptor agonists if A1c is high. - Discuss insurance coverage and copay options for GLP-1 receptor agonists.  General Health Maintenance Discussed healthy lifestyle through diet and exercise. Emphasized importance of regular eye exams for diabetes management. - Encourage healthy diet and regular exercise. - Schedule annual eye exam and inform ophthalmologist about diabetes. - Ensure ophthalmologist sends exam results to family doctor.  Follow-up Advised on completing lab work and scheduling wellness visit in December. - Complete lab work in the next few weeks. - Schedule wellness visit in December. - Communicate lab results and treatment options via MyChart.      + Labs ordered await the results  We did discuss possibility of GLP-1's Wellness exam 6 months

## 2024-06-03 IMAGING — CR DG CHEST 2V
3 series · 3 of 3 positions shown · non-contrast
Comparison: None Available.

CLINICAL DATA: Left-sided chest pain over the last year, worsening
recently.

EXAM:
CHEST - 2 VIEW

[chest pa]
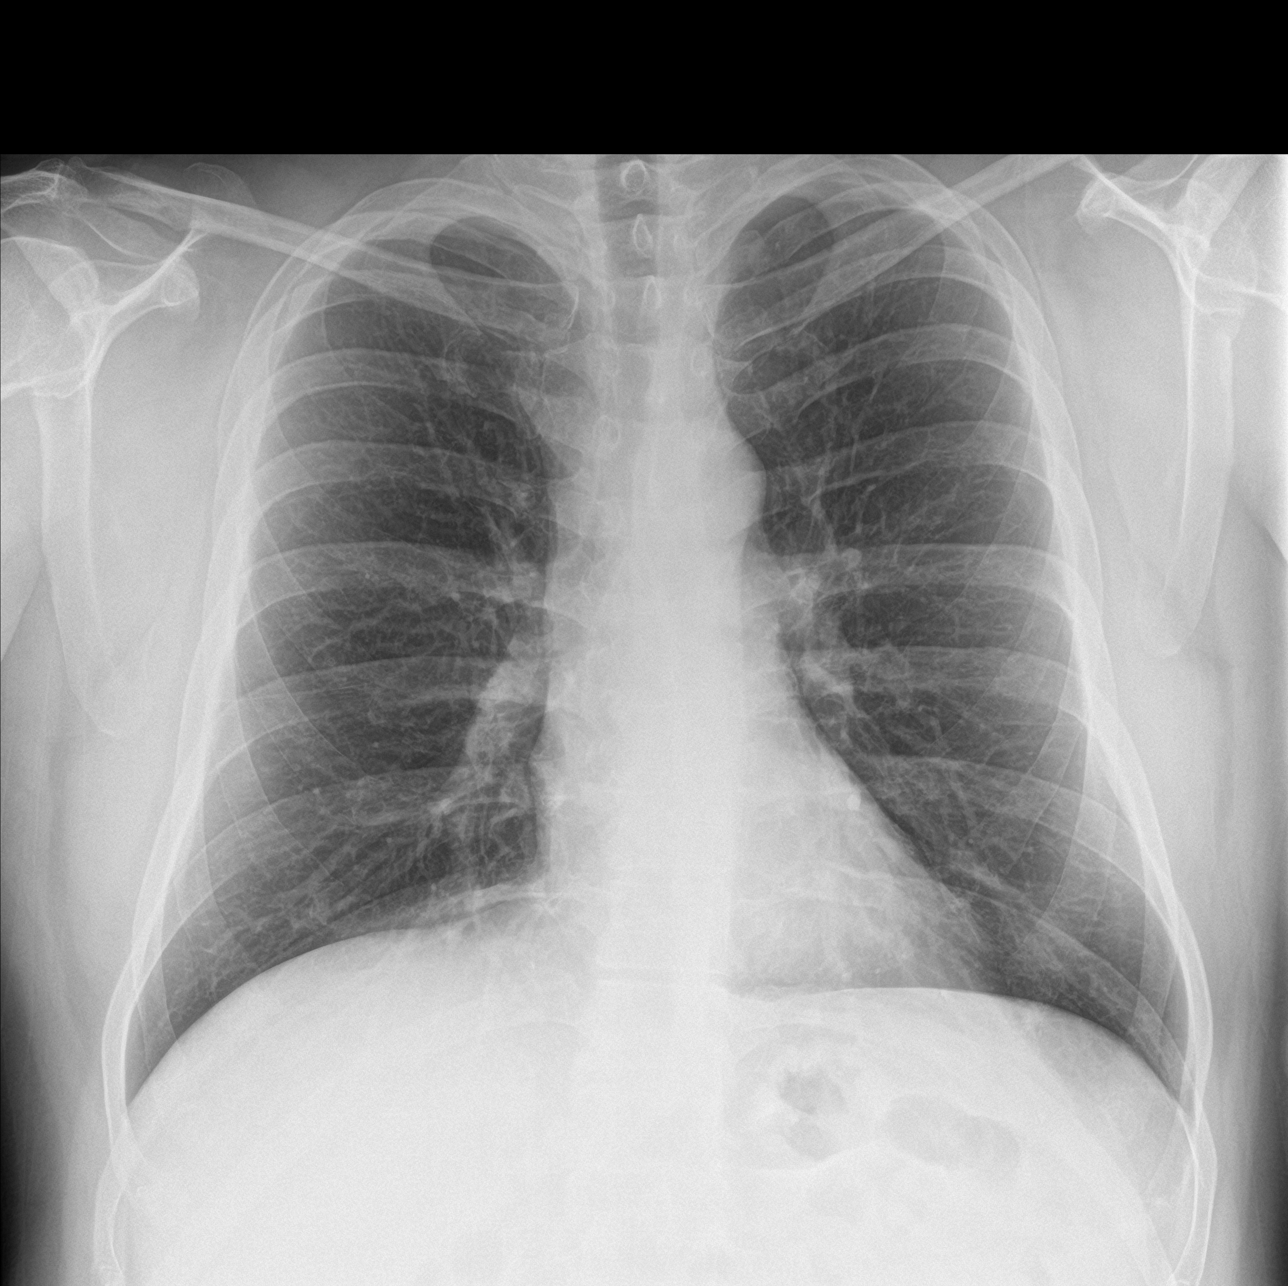

[chest lat (1 of 2)]
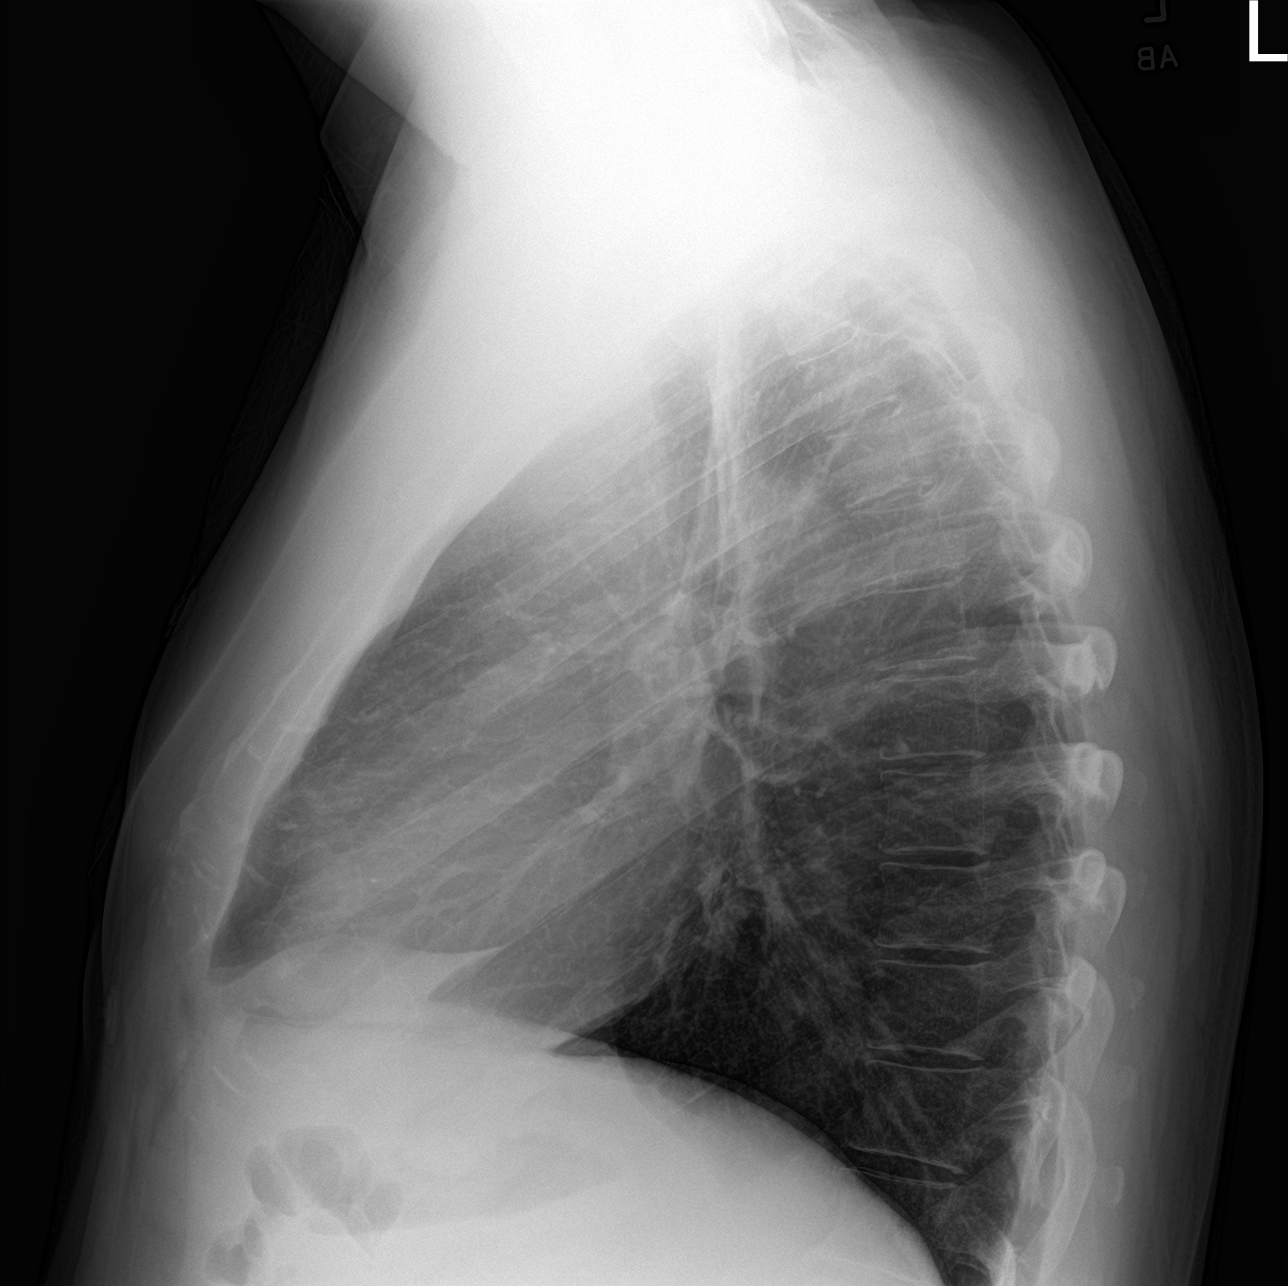

[chest lat (2 of 2)]
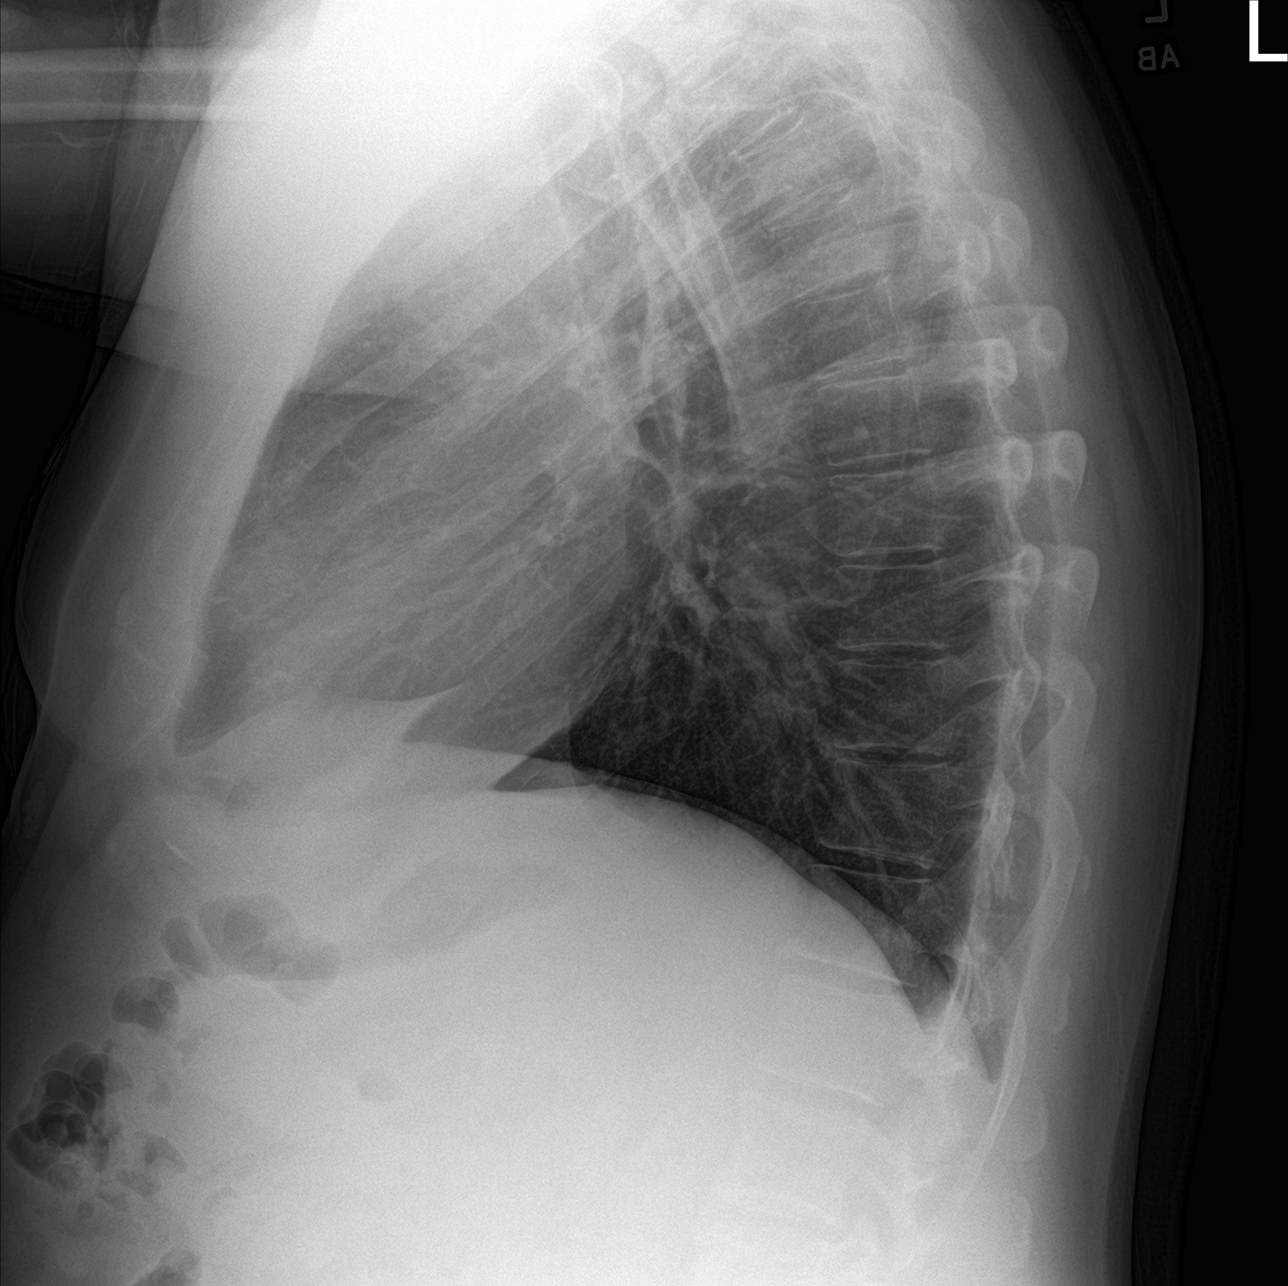

[3 of 3 positions shown; findings below may reference images not displayed]

FINDINGS: The heart size and mediastinal contours are within normal limits.
Both lungs are clear. The visualized skeletal structures are
unremarkable.
IMPRESSION: No active cardiopulmonary disease.

## 2024-07-11 ENCOUNTER — Other Ambulatory Visit (HOSPITAL_COMMUNITY): Payer: Self-pay

## 2024-07-12 ENCOUNTER — Other Ambulatory Visit (HOSPITAL_COMMUNITY): Payer: Self-pay

## 2024-07-12 ENCOUNTER — Telehealth: Payer: Self-pay | Admitting: Pharmacy Technician

## 2024-07-12 NOTE — Telephone Encounter (Signed)
 Pharmacy Patient Advocate Encounter   Received notification from CoverMyMeds that prior authorization for Ventolin  HFA 108 (90 Base)MCG/ACT aerosol is required/requested.   Insurance verification completed.   The patient is insured through [facility] .   Per test claim:  GENERIC ALBUTEROL  HFA is preferred by the insurance.  If suggested medication is appropriate, Please send in a new RX and discontinue this one. If not, please advise as to why it's not appropriate so that we may request a Prior Authorization. Please note, some preferred medications may still require a PA.  If the suggested medications have not been trialed and there are no contraindications to their use, the PA will not be submitted, as it will not be approved.  CALLED PATIENT'S PHARMACY AND THEY ADVISED TO DISREGARD PA REQUEST. PT WANTS AND PAYS CASH FOR THE BRAND NAME VENTOLIN  HFA. HE IS AWARE THAT HIS INSURANCE DOES NOT COVER IT.

## 2024-08-22 ENCOUNTER — Other Ambulatory Visit: Payer: Self-pay | Admitting: Family Medicine

## 2024-08-22 DIAGNOSIS — J4540 Moderate persistent asthma, uncomplicated: Secondary | ICD-10-CM

## 2024-11-25 LAB — HEPATIC FUNCTION PANEL
ALT: 35 IU/L (ref 0–44)
AST: 25 IU/L (ref 0–40)
Albumin: 4.5 g/dL (ref 3.8–4.9)
Alkaline Phosphatase: 76 IU/L (ref 47–123)
Bilirubin Total: 0.4 mg/dL (ref 0.0–1.2)
Bilirubin, Direct: 0.13 mg/dL (ref 0.00–0.40)
Total Protein: 6.7 g/dL (ref 6.0–8.5)

## 2024-11-25 LAB — BASIC METABOLIC PANEL WITH GFR
BUN/Creatinine Ratio: 15 (ref 9–20)
BUN: 14 mg/dL (ref 6–24)
CO2: 24 mmol/L (ref 20–29)
Calcium: 9.7 mg/dL (ref 8.7–10.2)
Chloride: 102 mmol/L (ref 96–106)
Creatinine, Ser: 0.91 mg/dL (ref 0.76–1.27)
Glucose: 149 mg/dL — ABNORMAL HIGH (ref 70–99)
Potassium: 4.4 mmol/L (ref 3.5–5.2)
Sodium: 140 mmol/L (ref 134–144)
eGFR: 101 mL/min/1.73 (ref >59)

## 2024-11-25 LAB — LIPID PANEL
Chol/HDL Ratio: 3.3 ratio (ref 0.0–5.0)
Cholesterol, Total: 149 mg/dL (ref 100–199)
HDL: 45 mg/dL (ref >39)
LDL Chol Calc (NIH): 79 mg/dL (ref 0–99)
Triglycerides: 144 mg/dL (ref 0–149)
VLDL Cholesterol Cal: 25 mg/dL (ref 5–40)

## 2024-11-25 LAB — HEMOGLOBIN A1C
Est. average glucose Bld gHb Est-mCnc: 192 mg/dL
Hgb A1c MFr Bld: 8.3 % — ABNORMAL HIGH (ref 4.8–5.6)

## 2024-11-26 ENCOUNTER — Ambulatory Visit: Payer: Self-pay | Admitting: Family Medicine

## 2024-12-06 ENCOUNTER — Encounter: Payer: Self-pay | Admitting: Family Medicine

## 2024-12-06 ENCOUNTER — Ambulatory Visit: Admitting: Family Medicine

## 2024-12-06 VITALS — BP 141/80 | HR 81 | Ht 71.0 in | Wt 250.4 lb

## 2024-12-06 DIAGNOSIS — I10 Essential (primary) hypertension: Secondary | ICD-10-CM

## 2024-12-06 DIAGNOSIS — E119 Type 2 diabetes mellitus without complications: Secondary | ICD-10-CM

## 2024-12-06 DIAGNOSIS — Z79899 Other long term (current) drug therapy: Secondary | ICD-10-CM

## 2024-12-06 DIAGNOSIS — E1169 Type 2 diabetes mellitus with other specified complication: Secondary | ICD-10-CM

## 2024-12-06 DIAGNOSIS — Z125 Encounter for screening for malignant neoplasm of prostate: Secondary | ICD-10-CM

## 2024-12-06 MED ORDER — ROSUVASTATIN CALCIUM 20 MG PO TABS: ORAL_TABLET | ORAL | 6 refills | Status: AC

## 2024-12-06 MED ORDER — TIRZEPATIDE 2.5 MG/0.5ML ~~LOC~~ SOAJ: 2.5000 mg | SUBCUTANEOUS | 2 refills | Status: DC

## 2024-12-06 MED ORDER — LISINOPRIL 5 MG PO TABS: ORAL_TABLET | ORAL | 5 refills | Status: AC

## 2024-12-06 MED ORDER — CITALOPRAM HYDROBROMIDE 20 MG PO TABS: 20.0000 mg | ORAL_TABLET | Freq: Every day | ORAL | 5 refills | Status: AC

## 2024-12-06 MED ORDER — METFORMIN HCL 500 MG PO TABS: 500.0000 mg | ORAL_TABLET | Freq: Two times a day (BID) | ORAL | 6 refills | Status: AC

## 2024-12-06 NOTE — Progress Notes (Signed)
° °  Subjective:    Patient ID: Frank Flores, male    DOB: July 02, 1971, 53 y.o.   MRN: [id number]  HPI Patient is here for a follow up and check on medication he is prescribed  Patient is diabetic Recently went through the loss of his mother It was stressful for him He states dietary things got off from his usual healthy diet He does relate he is doing better dietary His weight is significantly up He is on metformin  Tolerates it well Takes all of his medicines including cholesterol medicine regular basis Takes his blood pressure medicine regular basis without trouble Denies personal or family history of pancreatitis or thyroid cancer Patient familiar with GLP-1's GLP-1 mechanism of action and side effects discussed with patient  Review of Systems     Objective:   Physical Exam  General-in no acute distress Eyes-no discharge Lungs-respiratory rate normal, CTA CV-no murmurs,RRR Extremities skin warm dry  Neuro grossly normal Behavior normal, alert       Assessment & Plan:  Recommend patient do yearly ophthalmologic exam  Diabetes-not under good control currently Important to get A1c to a safer zone goal 6.5 Patient on metformin  not doing enough for him We discussed GLP-1's risk and benefits Will go ahead and start Mounjaro 2.5 mg once a week Patient will give us  feedback after utilizing the medicine for 3 to 4 weeks Side effects discussed  Continue blood pressure medicines Healthy diet Stay active  Continue cholesterol medicine LDL above goal With GLP-1 more than likely will see LDL become better  Patient stress and moods are doing well Continue current meds

## 2024-12-06 NOTE — Progress Notes (Signed)
° °  Subjective:    Patient ID: Frank Flores, male    DOB: 04/12/1971, 53 y.o.   MRN: [id number]  HPI    Review of Systems     Objective:   Physical Exam        Assessment & Plan:

## 2025-01-09 ENCOUNTER — Encounter: Payer: Self-pay | Admitting: Family Medicine

## 2025-01-10 ENCOUNTER — Other Ambulatory Visit: Payer: Self-pay | Admitting: Family Medicine

## 2025-01-10 MED ORDER — TIRZEPATIDE 5 MG/0.5ML ~~LOC~~ SOAJ: 5.0000 mg | SUBCUTANEOUS | 4 refills | Status: AC

## 2025-06-13 ENCOUNTER — Ambulatory Visit: Admitting: Family Medicine
